# Patient Record
Sex: Male | Born: 1970
Health system: Southern US, Community
[De-identification: ages and names within clinical notes are randomized; demographics above are authoritative.]

## PROBLEM LIST (undated history)

## (undated) DIAGNOSIS — E119 Type 2 diabetes mellitus without complications: Secondary | ICD-10-CM

## (undated) DIAGNOSIS — F419 Anxiety disorder, unspecified: Secondary | ICD-10-CM

## (undated) DIAGNOSIS — D571 Sickle-cell disease without crisis: Secondary | ICD-10-CM

## (undated) DIAGNOSIS — I10 Essential (primary) hypertension: Secondary | ICD-10-CM

## (undated) HISTORY — PX: DENTAL SURGERY: SHX609

## (undated) HISTORY — DX: Type 2 diabetes mellitus without complications: E11.9

## (undated) HISTORY — DX: Anxiety disorder, unspecified: F41.9

## (undated) HISTORY — PX: CHOLECYSTECTOMY: SHX55

## (undated) HISTORY — PX: OTHER SURGICAL HISTORY: SHX169

## (undated) HISTORY — DX: Sickle-cell disease without crisis: D57.1

## (undated) HISTORY — PX: ROTATOR CUFF REPAIR: SHX139

---

## 1998-12-21 ENCOUNTER — Encounter: Payer: Self-pay | Admitting: Cardiology

## 1998-12-21 ENCOUNTER — Ambulatory Visit (HOSPITAL_COMMUNITY): Admission: RE | Admit: 1998-12-21 | Discharge: 1998-12-21 | Payer: Self-pay | Admitting: Cardiology

## 2001-02-12 ENCOUNTER — Other Ambulatory Visit: Admission: RE | Admit: 2001-02-12 | Discharge: 2001-02-12 | Payer: Self-pay | Admitting: Orthopedic Surgery

## 2006-03-02 ENCOUNTER — Emergency Department (HOSPITAL_COMMUNITY): Admission: EM | Admit: 2006-03-02 | Discharge: 2006-03-02 | Payer: Self-pay | Admitting: Emergency Medicine

## 2009-10-16 ENCOUNTER — Emergency Department (HOSPITAL_COMMUNITY): Admission: EM | Admit: 2009-10-16 | Discharge: 2009-10-17 | Payer: Self-pay | Admitting: Emergency Medicine

## 2010-09-26 ENCOUNTER — Ambulatory Visit: Payer: Self-pay | Admitting: Cardiology

## 2010-09-26 ENCOUNTER — Telehealth (INDEPENDENT_AMBULATORY_CARE_PROVIDER_SITE_OTHER): Payer: Self-pay | Admitting: Radiology

## 2010-09-27 ENCOUNTER — Encounter: Payer: Self-pay | Admitting: Cardiology

## 2010-09-27 ENCOUNTER — Encounter (INDEPENDENT_AMBULATORY_CARE_PROVIDER_SITE_OTHER): Payer: Self-pay | Admitting: *Deleted

## 2010-09-27 ENCOUNTER — Ambulatory Visit: Payer: Self-pay

## 2010-09-27 ENCOUNTER — Encounter (HOSPITAL_COMMUNITY)
Admission: RE | Admit: 2010-09-27 | Discharge: 2010-11-15 | Payer: Self-pay | Source: Home / Self Care | Attending: Cardiology | Admitting: Cardiology

## 2010-09-29 ENCOUNTER — Ambulatory Visit: Payer: Self-pay | Admitting: Cardiology

## 2010-10-04 ENCOUNTER — Ambulatory Visit: Payer: Self-pay | Admitting: Cardiology

## 2010-11-17 NOTE — Progress Notes (Signed)
Summary: Nuc Pre-Procedure  Phone Note Outgoing Call Call back at [facility] Phone [phone]   Call placed by: Leonia Corona, RT-N,  September 26, 2010 4:46 PM Call placed to: Patient Reason for Call: Confirm/change Appt Summary of Call: Left message with information on My[employer] Information Sheet (see scanned document for details).      Nuclear Med Background Indications for Stress Test: Evaluation for Ischemia     Symptoms: Chest Pain

## 2010-11-17 NOTE — Assessment & Plan Note (Signed)
Summary: Cardiology Nuclear Testing  Nuclear Med Background Indications for Stress Test: Evaluation for Ischemia     Symptoms: Chest Pain, Chest Pain with Exertion, SOB    Nuclear Pre-Procedure Caffeine/Decaff Intake: None NPO After: 7:00 AM Lungs: clear IV 0.9% NS with Angio Cath: 22g     IV Site: R Antecubital IV Started by: Burna Mortimer Deal, RT-N Chest Size (in) 42     Height (in): 70 Weight (lb): 215 BMI: 30.96  Nuclear Med Study 1 or 2 day study:  1 day     Stress Test Type:  Stress Reading MD:  Olga Millers, MD     Referring MD:  S.Tennant Resting Radionuclide:  Technetium 75m Tetrofosmin     Resting Radionuclide Dose:  11.0 mCi  Stress Radionuclide:  Technetium 51m Tetrofosmin     Stress Radionuclide Dose:  33.0 mCi   Stress Protocol Exercise Time (min):  8:00 min     Max HR:  164 bpm     Predicted Max HR:  179 bpm  Max Systolic BP: 162 mm Hg     Percent Max HR:  91.62 %     METS: 10.10 Rate Pressure Product:  16109    Stress Test Technologist:  Milana Na, EMT-P     Nuclear Technologist:  Domenic Polite, CNMT  Rest Procedure  Myocardial perfusion imaging was performed at rest 45 minutes following the intravenous administration of Technetium 1m Tetrofosmin.  Stress Procedure  The patient exercised for 8:00.  The patient stopped due to fatigue and chest tightness.  There were no significant ST-T wave changes.  Technetium 28m Tetrofosmin was injected at peak exercise and myocardial perfusion imaging was performed after a brief delay.  QPS Raw Data Images:  Acquisition technically good; normal left ventricular size. Stress Images:  There is decreased uptake in the apex. Rest Images:  There is decreased uptake in the apex. Subtraction (SDS):  No evidence of ischemia. Transient Ischemic Dilatation:  1.01  (Normal <1.22)  Lung/Heart Ratio:  .22  (Normal <0.45)  Quantitative Gated Spect Images QGS EDV:  106 ml QGS ESV:  45 ml QGS EF:  58 % QGS cine images:   Normal wall motion.   Overall Impression  Exercise Capacity: Fair exercise capacity. BP Response: Normal blood pressure response. Clinical Symptoms: There is chest pain ECG Impression: No significant ST segment change suggestive of ischemia. Overall Impression: Normal stress nuclear study with mild apical thinning but no ischemia or infarction.  Appended Document: Cardiology Nuclear Testing copy sent to Dr. Deborah Chalk

## 2010-11-17 NOTE — Letter (Signed)
Summary: Outpatient Coinsurance Notice  Outpatient Coinsurance Notice   Imported By: Marylou Mccoy 10/03/2010 18:43:35  _____________________________________________________________________  External Attachment:    Type:   Image     Comment:   External Document

## 2011-01-01 LAB — COMPREHENSIVE METABOLIC PANEL
ALT: 28 U/L (ref 0–53)
AST: 24 U/L (ref 0–37)
Albumin: 4.2 g/dL (ref 3.5–5.2)
Alkaline Phosphatase: 80 U/L (ref 39–117)
BUN: 6 mg/dL (ref 6–23)
Chloride: 105 mEq/L (ref 96–112)
GFR calc Af Amer: >60 mL/min (ref >60)
Potassium: 3.6 mEq/L (ref 3.5–5.1)
Sodium: 140 mEq/L (ref 135–145)
Total Bilirubin: 1.3 mg/dL — ABNORMAL HIGH (ref 0.3–1.2)

## 2011-01-01 LAB — URINALYSIS, ROUTINE W REFLEX MICROSCOPIC
Bilirubin Urine: NEGATIVE
Glucose, UA: NEGATIVE mg/dL
Hgb urine dipstick: NEGATIVE
Ketones, ur: NEGATIVE mg/dL
Nitrite: NEGATIVE
Protein, ur: NEGATIVE mg/dL
Specific Gravity, Urine: 1.019 (ref 1.005–1.030)
Urobilinogen, UA: 1 mg/dL (ref 0.0–1.0)
pH: 6 (ref 5.0–8.0)

## 2011-01-01 LAB — DIFFERENTIAL
Basophils Absolute: 0 10*3/uL (ref 0.0–0.1)
Basophils Relative: 1 % (ref 0–1)
Eosinophils Absolute: 0.6 10*3/uL (ref 0.0–0.7)
Eosinophils Relative: 8 % — ABNORMAL HIGH (ref 0–5)
Monocytes Absolute: 0.7 10*3/uL (ref 0.1–1.0)

## 2011-01-01 LAB — RAPID URINE DRUG SCREEN, HOSP PERFORMED
Amphetamines: NOT DETECTED
Barbiturates: NOT DETECTED
Benzodiazepines: NOT DETECTED
Cocaine: NOT DETECTED
Opiates: NOT DETECTED
Tetrahydrocannabinol: NOT DETECTED

## 2011-01-01 LAB — CBC
HCT: 40.1 % (ref 39.0–52.0)
Platelets: 208 10*3/uL (ref 150–400)
WBC: 7.2 10*3/uL (ref 4.0–10.5)

## 2011-01-01 LAB — ETHANOL: Alcohol, Ethyl (B): <5 mg/dL (ref 0–10)

## 2011-07-12 ENCOUNTER — Emergency Department (HOSPITAL_COMMUNITY)
Admission: EM | Admit: 2011-07-12 | Discharge: 2011-07-12 | Disposition: A | Discharge status: Home / Self Care | Payer: 59 | Attending: Emergency Medicine | Admitting: Emergency Medicine

## 2011-07-12 ENCOUNTER — Emergency Department (HOSPITAL_COMMUNITY): Payer: 59

## 2011-07-12 DIAGNOSIS — D573 Sickle-cell trait: Secondary | ICD-10-CM | POA: Insufficient documentation

## 2011-07-12 DIAGNOSIS — M25519 Pain in unspecified shoulder: Secondary | ICD-10-CM | POA: Insufficient documentation

## 2011-07-12 DIAGNOSIS — J189 Pneumonia, unspecified organism: Secondary | ICD-10-CM | POA: Insufficient documentation

## 2011-07-12 DIAGNOSIS — R079 Chest pain, unspecified: Secondary | ICD-10-CM | POA: Insufficient documentation

## 2011-07-12 DIAGNOSIS — Z79899 Other long term (current) drug therapy: Secondary | ICD-10-CM | POA: Insufficient documentation

## 2011-07-12 LAB — CBC
HCT: 36.6 % — ABNORMAL LOW (ref 39.0–52.0)
MCH: 25.6 pg — ABNORMAL LOW (ref 26.0–34.0)
MCHC: 35.5 g/dL (ref 30.0–36.0)
RDW: 16.1 % — ABNORMAL HIGH (ref 11.5–15.5)

## 2011-07-12 LAB — POCT I-STAT, CHEM 8
Calcium, Ion: 1.19 mmol/L (ref 1.12–1.32)
Glucose, Bld: 113 mg/dL — ABNORMAL HIGH (ref 70–99)
HCT: 42 % (ref 39.0–52.0)
TCO2: 26 mmol/L (ref 0–100)

## 2011-07-12 LAB — DIFFERENTIAL
Basophils Absolute: 0 10*3/uL (ref 0.0–0.1)
Basophils Relative: 0 % (ref 0–1)
Eosinophils Relative: 2 % (ref 0–5)
Lymphocytes Relative: 11 % — ABNORMAL LOW (ref 12–46)
Monocytes Absolute: 1.2 10*3/uL — ABNORMAL HIGH (ref 0.1–1.0)
Monocytes Relative: 10 % (ref 3–12)

## 2011-07-12 LAB — POCT I-STAT TROPONIN I: Troponin i, poc: 0 ng/mL (ref 0.00–0.08)

## 2011-07-25 ENCOUNTER — Other Ambulatory Visit: Payer: Self-pay | Admitting: Family Medicine

## 2011-07-25 ENCOUNTER — Ambulatory Visit
Admission: RE | Admit: 2011-07-25 | Discharge: 2011-07-25 | Disposition: A | Discharge status: Home / Self Care | Payer: 59 | Source: Ambulatory Visit | Attending: Family Medicine | Admitting: Family Medicine

## 2011-07-25 DIAGNOSIS — J189 Pneumonia, unspecified organism: Secondary | ICD-10-CM

## 2011-07-25 MED ORDER — IOHEXOL 300 MG/ML  SOLN: 100.0000 mL | Freq: Once | INTRAMUSCULAR | Status: AC | PRN

## 2011-07-31 ENCOUNTER — Ambulatory Visit (INDEPENDENT_AMBULATORY_CARE_PROVIDER_SITE_OTHER): Payer: 59 | Admitting: Pulmonary Disease

## 2011-07-31 ENCOUNTER — Encounter: Payer: Self-pay | Admitting: Pulmonary Disease

## 2011-07-31 VITALS — BP 112/76 | HR 96 | Temp 97.9°F | Ht 70.0 in | Wt 215.4 lb

## 2011-07-31 DIAGNOSIS — J90 Pleural effusion, not elsewhere classified: Secondary | ICD-10-CM

## 2011-07-31 NOTE — Assessment & Plan Note (Signed)
The patient is having persistent left chest pain with a left lower lobe infiltrate and moderate-sized effusion on chest CT.  I am presuming this is pneumonia, with a parapneumonic effusion, although his clinical history was not overly impressive for this diagnosis.  He has no risk factors for thromboembolic disease, and his CT chest ruled out PE to at least large and medium-sized vessels.  The patient does have a history of sickle cell anemia, but I would find it unlikely this represents acute chest syndrome since I would expect him to be much sicker.  At this point, I would like to do thoracentesis for fluid removal and analysis, and we'll then see how he does.

## 2011-07-31 NOTE — Patient Instructions (Signed)
Will set up procedure to have fluid removed from chest. Will call once I have the analysis back on the fluid.

## 2011-07-31 NOTE — Progress Notes (Signed)
  Subjective:    Patient ID: William Lin, male    DOB: 1971-05-13, 40 y.o.   MRN: [id number]  HPI The patient is a 40 year old male who I have been asked to see for left-sided chest pain.  The patient was in his usual state of health until the end of September,  when he developed a fairly rapid onset of left-sided chest pain.  He denied any cough or mucus production at that time, but did note shortness of breath if he tried to lie down.  He presented to the emergency room where a chest x-ray showed very small lung volumes because of splinting, as well as bi-basilar atelectasis/airspace disease.  He was treated with a Z-Pak, and although he felt a little better he still had left-sided chest pain.  He was seen in his primary care office the first day of October, and was treated with 10 days of Avelox.  He came back for followup on October 8, and a chest x-ray showed an increasing infiltrate in the left base, and improvement on the right.  He subsequently underwent CT chest which showed a dense left lower lobe infiltrate and moderate left pleural effusion.  There were really no other significant abnormalities.  There was no evidence for pulmonary embolus in the large and medium sized vessels, however a small clot distally could not be excluded.  The patient during all of this did develop a dry cough, but had no chest congestion or mucus.  He denied any fever, but would have chills.  The patient denies any lower extremity swelling, and has no risk factors for thromboembolic disease.  He does have a history of sickle cell anemia, but has not had a sickle cell crisis ever.   Review of Systems  Constitutional: Negative for fever and unexpected weight change.  HENT: Negative for ear pain, nosebleeds, congestion, sore throat, rhinorrhea, sneezing, trouble swallowing, dental problem, postnasal drip and sinus pressure.   Eyes: Negative for redness and itching.  Respiratory: Positive for cough and shortness of  breath. Negative for chest tightness and wheezing.   Cardiovascular: Positive for chest pain. Negative for palpitations and leg swelling.  Gastrointestinal: Negative for nausea and vomiting.  Genitourinary: Negative for dysuria.  Musculoskeletal: Negative for joint swelling.  Skin: Negative for rash.  Neurological: Positive for headaches.  Hematological: Does not bruise/bleed easily.  Psychiatric/Behavioral: Negative for dysphoric mood. The patient is nervous/anxious.        Objective:   Physical Exam Constitutional:  Well developed, no acute distress  HENT:  Nares patent without discharge  Oropharynx without exudate, palate and uvula are normal  Eyes:  Perrla, eomi, no scleral icterus  Neck:  No JVD, no TMG  Cardiovascular:  Normal rate, regular rhythm, no rubs or gallops.  No murmurs        Intact distal pulses  Pulmonary :  Decreased bs left base with dullness to percussion, no stridor or respiratory distress   No rales, rhonchi, or wheezing  Abdominal:  Soft, nondistended, bowel sounds present.  No tenderness noted.   Musculoskeletal:  No lower extremity edema noted.  Lymph Nodes:  No cervical lymphadenopathy noted  Skin:  No cyanosis noted  Neurologic:  Alert, appropriate, moves all 4 extremities without obvious deficit.         Assessment & Plan:

## 2011-08-01 ENCOUNTER — Other Ambulatory Visit: Payer: Self-pay | Admitting: Pulmonary Disease

## 2011-08-01 ENCOUNTER — Telehealth: Payer: Self-pay | Admitting: Pulmonary Disease

## 2011-08-01 ENCOUNTER — Ambulatory Visit (HOSPITAL_COMMUNITY)
Admission: RE | Admit: 2011-08-01 | Discharge: 2011-08-01 | Disposition: A | Discharge status: Home / Self Care | Payer: 59 | Source: Ambulatory Visit | Attending: Pulmonary Disease | Admitting: Pulmonary Disease

## 2011-08-01 ENCOUNTER — Other Ambulatory Visit: Payer: Self-pay | Admitting: Interventional Radiology

## 2011-08-01 DIAGNOSIS — J90 Pleural effusion, not elsewhere classified: Secondary | ICD-10-CM | POA: Insufficient documentation

## 2011-08-01 DIAGNOSIS — J189 Pneumonia, unspecified organism: Secondary | ICD-10-CM

## 2011-08-01 LAB — BODY FLUID CELL COUNT WITH DIFFERENTIAL
Eos, Fluid: 0 %
Lymphs, Fluid: 54 %
Other Cells, Fluid: 0 %

## 2011-08-01 LAB — PROTEIN, BODY FLUID: Total protein, fluid: 5.9 g/dL

## 2011-08-01 LAB — PATHOLOGIST SMEAR REVIEW

## 2011-08-01 LAB — GLUCOSE, SEROUS FLUID

## 2011-08-01 NOTE — Telephone Encounter (Signed)
Spoke with patient-aware that we will not have the results back this fast as we have to send the fluid out and [facility] will call patient with results per OV notes on 07-31-11; pt will then know when to follow up with [facility] as well. If any other questions or concerns call our office.  Will send to [facility] as a reminder.

## 2011-08-02 NOTE — Telephone Encounter (Signed)
Discussed results with pt, and explained is exudative and prob due to parapneumonic effusion.  I asked him to call for appt to see me in 3wks with cxr, and to call if pleuritic pain worsens.

## 2011-08-03 NOTE — Telephone Encounter (Signed)
Pt called back.  Scheduled appt with KC on 08/24/11 @ 9:45am.  Patient aware.  [facility]

## 2011-08-03 NOTE — Telephone Encounter (Signed)
lmomtcb so that we can sched this 3 wk rov

## 2011-08-05 LAB — BODY FLUID CULTURE: Culture: NO GROWTH

## 2011-08-16 ENCOUNTER — Institutional Professional Consult (permissible substitution): Payer: 59 | Admitting: Internal Medicine

## 2011-08-23 ENCOUNTER — Other Ambulatory Visit: Payer: Self-pay | Admitting: Pulmonary Disease

## 2011-08-23 DIAGNOSIS — J90 Pleural effusion, not elsewhere classified: Secondary | ICD-10-CM

## 2011-08-24 ENCOUNTER — Ambulatory Visit (INDEPENDENT_AMBULATORY_CARE_PROVIDER_SITE_OTHER)
Admission: RE | Admit: 2011-08-24 | Discharge: 2011-08-24 | Disposition: A | Discharge status: Home / Self Care | Payer: 59 | Source: Ambulatory Visit | Attending: Pulmonary Disease | Admitting: Pulmonary Disease

## 2011-08-24 ENCOUNTER — Ambulatory Visit (INDEPENDENT_AMBULATORY_CARE_PROVIDER_SITE_OTHER): Payer: 59 | Admitting: Pulmonary Disease

## 2011-08-24 ENCOUNTER — Encounter: Payer: Self-pay | Admitting: Pulmonary Disease

## 2011-08-24 VITALS — BP 106/62 | HR 92 | Temp 97.8°F | Ht 70.0 in | Wt 216.2 lb

## 2011-08-24 DIAGNOSIS — J90 Pleural effusion, not elsewhere classified: Secondary | ICD-10-CM

## 2011-08-24 NOTE — Assessment & Plan Note (Signed)
The patient has had a left pleural effusion that I suspect is parapneumonic in nature.  His thoracentesis was exudative in nature, but otherwise nonspecific.  His chest x-ray today shows minimal residual thickening/fluid, and he is clearly improved clinically.  At this point, I would recommend no further intervention, and we'll see how he does over time.

## 2011-08-24 NOTE — Patient Instructions (Signed)
Your chest xray is much improved.  No further intervention recommended. followup with me as needed.

## 2011-08-24 NOTE — Progress Notes (Signed)
  Subjective:    Patient ID: William Lin, male    DOB: [birth year], 40 y.o.   MRN: [id number]  HPI Patient comes in today for followup of his left pleural effusion.  He has undergone thoracentesis which showed a nonspecific exudate.  This was felt to be most consistent with a parapneumonic effusion, and he had received adequate antibiotic treatment.  He is had a followup chest x-ray today which shows much improved lung volumes, and minimal pleural thickening/fluid on the lateral view.  The patient states that his chest discomfort has nearly totally resolved, and only on occasions does he feel discomfort.  He feels that his breathing is improved, and he is taking deeper breaths.   Review of Systems  Constitutional: Negative for fever and unexpected weight change.  HENT: Negative for ear pain, nosebleeds, congestion, sore throat, rhinorrhea, sneezing, trouble swallowing, dental problem, postnasal drip and sinus pressure.   Eyes: Negative for redness and itching.  Respiratory: Positive for shortness of breath. Negative for cough, chest tightness and wheezing.   Cardiovascular: Negative for palpitations and leg swelling.  Gastrointestinal: Negative for nausea and vomiting.  Genitourinary: Negative for dysuria.  Musculoskeletal: Negative for joint swelling.  Skin: Negative for rash.  Neurological: Negative for headaches.  Hematological: Does not bruise/bleed easily.  Psychiatric/Behavioral: Negative for dysphoric mood. The patient is nervous/anxious.        Objective:   Physical Exam Well-developed male in no acute distress  Nose without purulence or discharge noted Chest with very minimal left basilar crackles, otherwise totally clear Cardiac exam is regular rate and rhythm Lower extremities without edema, no cyanosis noted Alert and oriented, moves all 4 extremities.       Assessment & Plan:

## 2012-01-18 ENCOUNTER — Encounter: Payer: Self-pay | Admitting: *Deleted

## 2012-01-31 ENCOUNTER — Ambulatory Visit (INDEPENDENT_AMBULATORY_CARE_PROVIDER_SITE_OTHER)
Admission: RE | Admit: 2012-01-31 | Discharge: 2012-01-31 | Disposition: A | Discharge status: Home / Self Care | Payer: 59 | Source: Ambulatory Visit | Attending: Pulmonary Disease | Admitting: Pulmonary Disease

## 2012-01-31 ENCOUNTER — Encounter: Payer: Self-pay | Admitting: Pulmonary Disease

## 2012-01-31 ENCOUNTER — Ambulatory Visit (INDEPENDENT_AMBULATORY_CARE_PROVIDER_SITE_OTHER): Payer: 59 | Admitting: Pulmonary Disease

## 2012-01-31 VITALS — BP 118/78 | HR 87 | Temp 97.8°F | Ht 70.5 in | Wt 225.8 lb

## 2012-01-31 DIAGNOSIS — J90 Pleural effusion, not elsewhere classified: Secondary | ICD-10-CM

## 2012-01-31 NOTE — Patient Instructions (Signed)
Will check cxr today, and call you with results. If the xray looks worse, will need to rescan your chest.  If better or normal, would not intervene, and see how your symptoms do over time.  If your discomfort continues, and it is bothering you enough to consider aggressive intervention, let me know.

## 2012-01-31 NOTE — Progress Notes (Signed)
  Subjective:    Patient ID: William Lin, male    DOB: 1971-03-19, 41 y.o.   MRN: [id number]  HPI The patient comes in today for an acute sick visit.  He has a history of pleural thickening and parapneumonic effusion, however at the last visit was felt to have a fairly good result from his initial insult.  He had been doing fairly well, but continues to have intermittent chest discomfort on the left laterally that is very similar to his pain in the past when all this started.  It typically can occur weekly to every other day, and is mild in nature compared to his initial problem.  Continues to have a sharp character to the discomfort.  He does not have substernal chest pressure.  The patient has had no significant cough, change in his breathing, or fever.   Review of Systems  Constitutional: Negative.  Negative for fever and unexpected weight change.  HENT: Negative.  Negative for ear pain, nosebleeds, congestion, sore throat, rhinorrhea, sneezing, trouble swallowing, dental problem, postnasal drip and sinus pressure.   Eyes: Negative.  Negative for redness and itching.  Respiratory: Positive for chest tightness. Negative for cough, shortness of breath and wheezing.   Cardiovascular: Positive for chest pain. Negative for palpitations and leg swelling.  Gastrointestinal: Negative.  Negative for nausea and vomiting.  Genitourinary: Negative.  Negative for dysuria.  Musculoskeletal: Negative.  Negative for joint swelling.  Skin: Negative.  Negative for rash.  Neurological: Negative.  Negative for headaches.  Hematological: Negative.  Does not bruise/bleed easily.  Psychiatric/Behavioral: Negative.  Negative for dysphoric mood. The patient is not nervous/anxious.        Objective:   Physical Exam Well-developed male in no acute distress Nose without purulence or discharge noted Chest with good air flow bilaterally, mild decrease in breath sounds in the left base.  No significant crackles or  rubs noted Cardiac exam with regular rate and rhythm Lower extremities without edema, no cyanosis Alert and oriented, moves all 4 extremities.       Assessment & Plan:

## 2012-01-31 NOTE — Assessment & Plan Note (Signed)
The patient is having some mild chest discomfort on the left that is similar to his discomfort in the past.  It is not every day, and it is not really severe.  The patient had some concerns, and just wanted to make sure there were no complicating factors.  He does not have worsening breath sounds or a rub on exam today.  I would like to check a chest x-ray today to make sure the chronic changes have not worsened.  If it is stable or better, I would continue to follow this for now.  If the x-ray is worse, or the patient is having discomfort that he feels is significantly impacting him, would do followup CT chest.

## 2014-04-14 ENCOUNTER — Encounter: Payer: Self-pay | Admitting: Cardiology

## 2015-09-15 ENCOUNTER — Encounter: Payer: Commercial Managed Care - HMO | Attending: Family Medicine | Admitting: *Deleted

## 2015-09-15 ENCOUNTER — Encounter: Payer: Self-pay | Admitting: *Deleted

## 2015-09-15 VITALS — Ht 70.5 in | Wt 220.1 lb

## 2015-09-15 DIAGNOSIS — E119 Type 2 diabetes mellitus without complications: Secondary | ICD-10-CM | POA: Insufficient documentation

## 2015-09-15 DIAGNOSIS — Z713 Dietary counseling and surveillance: Secondary | ICD-10-CM | POA: Diagnosis not present

## 2015-09-15 NOTE — Patient Instructions (Signed)
Plan:  Include protein in moderation with your meals and snacks Consider  increasing your activity level by walking for 30 minutes daily as tolerated Continue taking medication as directed by MD  Decrease pure fruit juice / dilute Consider water with flavorings Tea with 1/2 & 1/2 or unsweet + splenda Rather than a biscuit consider an [place] Muffin Only have 2 slices of Pizza rather than 3, consider having a salad with it Discontinue Pop Tarts  ALWAYS HAVE PROTEIN WITH YOUR CARBOHYDRATE! Consider Dannon Lite & Fit Greek Yogurt

## 2015-09-15 NOTE — Progress Notes (Signed)
Diabetes Self-Management Education  Visit Type: First/Initial  Appt. Start Time: 0800 Appt. End Time: 1000  09/15/2015  [name], identified by name and date of birth, is a 44 y.o. male with a diagnosis of Diabetes: Type 2. William Lin presents with a new diagnosis of T2DM. He is married with children and notes a great deal of transition and stress in the household. He works full-time for [employer] in [employer]. His job is sedentary and travels occasionally. Fast Food is common in their household. In review of his dietary intake he consumes a great deal of fruit and fruit juice. This morning he had 2 pieces of fruit for breakfast. 2hpp reading 277mg /dl. He voiced a desire to test his glucose: Meter dispensed: One Touch Verio Flex, [id number] X, Exp. 09/2016. I suggested her contact Dr. Hyacinth MeekerMiller for an order for testing supplies.   ASSESSMENT  Height 5' 10.5" (1.791 m), weight 220 lb 1.6 oz (99.837 kg). Body mass index is 31.12 kg/(m^2).      Diabetes Self-Management Education - 09/15/15 0840    Visit Information   Visit Type First/Initial   Initial Visit   Diabetes Type Type 2   Are you currently following a meal plan? No   Are you taking your medications as prescribed? Yes   Date Diagnosed 09/05/15   Health Coping   How would you rate your overall health? Good   Psychosocial Assessment   Patient Belief/Attitude about Diabetes Afraid   Self-care barriers None   Self-management support Doctor's office;Family;CDE visits   Other persons present Patient   Patient Concerns Nutrition/Meal planning;Medication;Monitoring;Healthy Lifestyle;Glycemic Control   Special Needs None   Preferred Learning Style No preference indicated   Learning Readiness Change in progress   How often do you need to have someone help you when you read instructions, pamphlets, or other written materials from your doctor or pharmacy? 1 - Never   What is the last grade level you  completed in school? Bachelor Degree   Complications   Last HgB A1C per patient/outside source 6.1 %   How often do you check your blood sugar? Not recommended by provider   Have you had a dilated eye exam in the past 12 months? Yes   Have you had a dental exam in the past 12 months? Yes   Are you checking your feet? No   Dietary Intake   Breakfast toast & butter, apple grape juice / sausage & egg biscuit, sweet tea/ pop tart   Lunch sushi, [place], [employer]   Snack (afternoon) [place] tea, brownie, Designer, industrial/productswiss roll   Dinner pizza, McDonal[employer], (fast food)   Snack (evening) Oatmeal Raisin cookies, fruit   Beverage(s) fruit juice, Lipton Green Tea, Regular decaf soda, Hi-C Orange, lemonade   Exercise   Exercise Type ADL's   Patient Education   Previous Diabetes Education No   Disease state  Definition of diabetes, type 1 and 2, and the diagnosis of diabetes;Factors that contribute to the development of diabetes   Nutrition management  Role of diet in the treatment of diabetes and the relationship between the three main macronutrients and blood glucose level;Meal options for control of blood glucose level and chronic complications.   Physical activity and exercise  Role of exercise on diabetes management, blood pressure control and cardiac health.   Medications Reviewed patients medication for diabetes, action, purpose, timing of dose and side effects.   Monitoring Taught/evaluated SMBG meter.;Purpose and frequency of SMBG.;Taught/discussed recording of test results and  interpretation of SMBG.   Chronic complications Relationship between chronic complications and blood glucose control   Psychosocial adjustment Role of stress on diabetes   Personal strategies to promote health Lifestyle issues that need to be addressed for better diabetes care   Individualized Goals (developed by patient)   Nutrition General guidelines for healthy choices and portions discussed   Physical Activity Exercise 3-5  times per week;30 minutes per day   Medications take my medication as prescribed   Monitoring  test blood glucose pre and post meals as discussed  Patient requested glucose meter and desire to test.   Reducing Risk examine blood glucose patterns;increase portions of nuts and seeds;increase portions of olive oil in diet   Outcomes   Expected Outcomes Demonstrated interest in learning. Expect positive outcomes   Future DMSE 4-6 wks   Program Status Completed      Individualized Plan for Diabetes Self-Management Training:   Learning Objective:  Patient will have a greater understanding of diabetes self-management. Patient education plan is to attend individual and/or group sessions per assessed needs and concerns.   Plan:   Patient Instructions  Plan:  Include protein in moderation with your meals and snacks Consider  increasing your activity level by walking for 30 minutes daily as tolerated Continue taking medication as directed by MD  Decrease pure fruit juice / dilute Consider water with flavorings Tea with 1/2 & 1/2 or unsweet + splenda Rather than a biscuit consider an [place] Muffin Only have 2 slices of Pizza rather than 3, consider having a salad with it Discontinue Pop Tarts  ALWAYS HAVE PROTEIN WITH YOUR CARBOHYDRATE! Consider Dannon Lite & Fit Greek Yogurt  Expected Outcomes:  Demonstrated interest in learning. Expect positive outcomes  Education material provided: Living Well with Diabetes, A1C conversion sheet, Meal plan card, My Plate and Snack sheet  If problems or questions, patient to contact team via:  Phone  Future DSME appointment: 4-6 wks

## 2015-10-19 ENCOUNTER — Encounter: Payer: Commercial Managed Care - HMO | Attending: Family Medicine | Admitting: *Deleted

## 2015-10-19 ENCOUNTER — Encounter: Payer: Self-pay | Admitting: *Deleted

## 2015-10-19 VITALS — Ht 70.5 in | Wt 213.9 lb

## 2015-10-19 DIAGNOSIS — E119 Type 2 diabetes mellitus without complications: Secondary | ICD-10-CM | POA: Insufficient documentation

## 2015-10-19 DIAGNOSIS — Z713 Dietary counseling and surveillance: Secondary | ICD-10-CM | POA: Insufficient documentation

## 2015-10-19 NOTE — Progress Notes (Signed)
Diabetes Self-Management Education  Visit Type: Follow-up  Appt. Start Time: 0800 Appt. End Time: 0900  10/19/2015  [name], identified by name and date of birth, is a 45 y.o. male with a diagnosis of Diabetes: Type 2. [name] returns for 6 week follow up related to T2DM. He has mad significant dietary modifications. In review of his intake we do still have some opportunity for improvement. His goal is to be able to discontinue his diabetes medication and manage is glucose through dietary modification and exercise. He understands that we do not cure diabetes but we manage it to the best of our ability. He has not integrated exercise into his daily route thus far. This is part of his plan.  ASSESSMENT  Height 5' 10.5" (1.791 m), weight 213 lb 14.4 oz (97.024 kg). Body mass index is 30.25 kg/(m^2).      Diabetes Self-Management Education - 10/19/15 [id number]    Visit Information   Visit Type Follow-up   Initial Visit   Diabetes Type Type 2   Are you currently following a meal plan? No   Are you taking your medications as prescribed? Yes   Health Coping   How would you rate your overall health? Good   Psychosocial Assessment   Patient Belief/Attitude about Diabetes Motivated to manage diabetes   Self-care barriers None   Self-management support Doctor's office;CDE visits;Family   Other persons present Patient   Patient Concerns Nutrition/Meal planning;Healthy Lifestyle;Glycemic Control   Special Needs None   Preferred Learning Style No preference indicated   Learning Readiness Change in progress   How often do you need to have someone help you when you read instructions, pamphlets, or other written materials from your doctor or pharmacy? 1 - Never   Complications   How often do you check your blood sugar? 1-2 times/day   Fasting Blood glucose range (mg/dL) [phone]  [phone]   Postprandial Blood glucose range (mg/dL) 47-425;>95670-129;>200  [phone]   Dietary Intake   Breakfast raisin  bran, 2% milk   6oz milk / yogurt Yoplait (/ fruit    Lunch frozen meals 45 grams of carbs   Snack (afternoon) fiber one bar   Dinner balanced protein, vegetable, carbs   Beverage(s) ICE beverage, apple juice, water with crystal light   Exercise   Exercise Type ADL's   Patient Education   Previous Diabetes Education Yes (please comment)   Nutrition management  Role of diet in the treatment of diabetes and the relationship between the three main macronutrients and blood glucose level;Meal options for control of blood glucose level and chronic complications.   Physical activity and exercise  Role of exercise on diabetes management, blood pressure control and cardiac health.   Medications Reviewed patients medication for diabetes, action, purpose, timing of dose and side effects.   Monitoring Purpose and frequency of SMBG.   Chronic complications Relationship between chronic complications and blood glucose control   Psychosocial adjustment Role of stress on diabetes;Identified and addressed patients feelings and concerns about diabetes  Patient goal is to come off medication and manage glucose with nutrition and exercise   Personal strategies to promote health Lifestyle issues that need to be addressed for better diabetes care  begin exercise regimen   Individualized Goals (developed by patient)   Nutrition General guidelines for healthy choices and portions discussed   Physical Activity Exercise 5-7 days per week;30 minutes per day   Medications take my medication as prescribed   Monitoring  test my  blood glucose as discussed   Problem Solving --  begin activity regimen for healthy heart, BP, cholesterol and glucose management   Reducing Risk increase portions of nuts and seeds   Patient Self-Evaluation of Goals - Patient rates self as meeting previously set goals (% of time)   Nutrition 50 - 75 %   Physical Activity < 25%   Medications >75%   Monitoring >75%   Reducing Risk 50 - 75 %    Health Coping 50 - 75 %   Outcomes   Expected Outcomes Demonstrated interest in learning. Expect positive outcomes   Future DMSE PRN   Program Status Completed   Subsequent Visit   Since your last visit have you continued or begun to take your medications as prescribed? Yes   Since your last visit have you experienced any weight changes? Loss   Weight Loss (lbs) 7   Since your last visit, are you checking your blood glucose at least once a day? Yes      Individualized Plan for Diabetes Self-Management Training:   Learning Objective:  Patient will have a greater understanding of diabetes self-management. Patient education plan is to attend individual and/or group sessions per assessed needs and concerns.   Plan:   Patient Instructions  Snack bars: [facility] Protein bars, [employer] PROTEIN If you have a piece of fruit always put some protein with it. 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 Expect positive outcomes  If problems or questions, patient to contact team via:  Phone  Future DSME appointment: PRN

## 2015-10-19 NOTE — Patient Instructions (Addendum)
Snack bars: [facility] Protein bars, [employer] Bars PROTEIN If you have a piece of fruit always put some protein with it. 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

## 2016-11-24 DIAGNOSIS — E119 Type 2 diabetes mellitus without complications: Secondary | ICD-10-CM | POA: Diagnosis not present

## 2016-11-24 DIAGNOSIS — Z0000 Encounter for general adult medical examination without abnormal findings: Secondary | ICD-10-CM | POA: Diagnosis not present

## 2016-11-24 DIAGNOSIS — R05 Cough: Secondary | ICD-10-CM | POA: Diagnosis not present

## 2016-11-24 DIAGNOSIS — R079 Chest pain, unspecified: Secondary | ICD-10-CM | POA: Diagnosis not present

## 2016-11-24 DIAGNOSIS — Z23 Encounter for immunization: Secondary | ICD-10-CM | POA: Diagnosis not present

## 2017-05-15 DIAGNOSIS — G4733 Obstructive sleep apnea (adult) (pediatric): Secondary | ICD-10-CM | POA: Diagnosis not present

## 2017-05-31 DIAGNOSIS — R6889 Other general symptoms and signs: Secondary | ICD-10-CM | POA: Diagnosis not present

## 2017-05-31 DIAGNOSIS — D649 Anemia, unspecified: Secondary | ICD-10-CM | POA: Diagnosis not present

## 2017-05-31 DIAGNOSIS — E119 Type 2 diabetes mellitus without complications: Secondary | ICD-10-CM | POA: Diagnosis not present

## 2017-06-01 DIAGNOSIS — H5213 Myopia, bilateral: Secondary | ICD-10-CM | POA: Diagnosis not present

## 2017-06-01 DIAGNOSIS — E119 Type 2 diabetes mellitus without complications: Secondary | ICD-10-CM | POA: Diagnosis not present

## 2017-06-25 DIAGNOSIS — N6012 Diffuse cystic mastopathy of left breast: Secondary | ICD-10-CM | POA: Diagnosis not present

## 2017-11-27 DIAGNOSIS — E119 Type 2 diabetes mellitus without complications: Secondary | ICD-10-CM | POA: Diagnosis not present

## 2017-11-27 DIAGNOSIS — E611 Iron deficiency: Secondary | ICD-10-CM | POA: Diagnosis not present

## 2017-11-27 DIAGNOSIS — Z0000 Encounter for general adult medical examination without abnormal findings: Secondary | ICD-10-CM | POA: Diagnosis not present

## 2017-11-27 DIAGNOSIS — I10 Essential (primary) hypertension: Secondary | ICD-10-CM | POA: Diagnosis not present

## 2017-12-05 DIAGNOSIS — E119 Type 2 diabetes mellitus without complications: Secondary | ICD-10-CM | POA: Diagnosis not present

## 2017-12-11 DIAGNOSIS — R05 Cough: Secondary | ICD-10-CM | POA: Diagnosis not present

## 2017-12-11 DIAGNOSIS — J209 Acute bronchitis, unspecified: Secondary | ICD-10-CM | POA: Diagnosis not present

## 2017-12-11 DIAGNOSIS — K219 Gastro-esophageal reflux disease without esophagitis: Secondary | ICD-10-CM | POA: Diagnosis not present

## 2017-12-11 DIAGNOSIS — J189 Pneumonia, unspecified organism: Secondary | ICD-10-CM | POA: Diagnosis not present

## 2017-12-17 DIAGNOSIS — Z87898 Personal history of other specified conditions: Secondary | ICD-10-CM | POA: Diagnosis not present

## 2017-12-17 DIAGNOSIS — J40 Bronchitis, not specified as acute or chronic: Secondary | ICD-10-CM | POA: Diagnosis not present

## 2017-12-17 DIAGNOSIS — R05 Cough: Secondary | ICD-10-CM | POA: Diagnosis not present

## 2017-12-17 DIAGNOSIS — R0989 Other specified symptoms and signs involving the circulatory and respiratory systems: Secondary | ICD-10-CM | POA: Diagnosis not present

## 2018-01-08 ENCOUNTER — Other Ambulatory Visit: Payer: Self-pay | Admitting: Family Medicine

## 2018-01-08 DIAGNOSIS — R109 Unspecified abdominal pain: Secondary | ICD-10-CM

## 2018-01-17 ENCOUNTER — Ambulatory Visit
Admission: RE | Admit: 2018-01-17 | Discharge: 2018-01-17 | Disposition: A | Discharge status: Home / Self Care | Payer: Commercial Managed Care - HMO | Source: Ambulatory Visit | Attending: Family Medicine | Admitting: Family Medicine

## 2018-01-17 DIAGNOSIS — R109 Unspecified abdominal pain: Secondary | ICD-10-CM | POA: Diagnosis not present

## 2018-01-23 DIAGNOSIS — R109 Unspecified abdominal pain: Secondary | ICD-10-CM | POA: Diagnosis not present

## 2018-02-12 DIAGNOSIS — R05 Cough: Secondary | ICD-10-CM | POA: Diagnosis not present

## 2018-02-12 DIAGNOSIS — J45909 Unspecified asthma, uncomplicated: Secondary | ICD-10-CM | POA: Diagnosis not present

## 2018-03-05 DIAGNOSIS — R05 Cough: Secondary | ICD-10-CM | POA: Diagnosis not present

## 2018-03-05 DIAGNOSIS — E118 Type 2 diabetes mellitus with unspecified complications: Secondary | ICD-10-CM | POA: Diagnosis not present

## 2018-03-05 DIAGNOSIS — H6123 Impacted cerumen, bilateral: Secondary | ICD-10-CM | POA: Diagnosis not present

## 2021-08-03 ENCOUNTER — Emergency Department: Payer: 59

## 2021-08-03 ENCOUNTER — Other Ambulatory Visit: Payer: Self-pay

## 2021-08-03 ENCOUNTER — Other Ambulatory Visit
Admission: RE | Admit: 2021-08-03 | Discharge: 2021-08-03 | Disposition: A | Discharge status: Home / Self Care | Payer: 59 | Source: Ambulatory Visit | Attending: Family Medicine | Admitting: Family Medicine

## 2021-08-03 ENCOUNTER — Observation Stay: Payer: 59

## 2021-08-03 ENCOUNTER — Inpatient Hospital Stay
Admission: EM | Admit: 2021-08-03 | Discharge: 2021-08-12 | DRG: 175 | Disposition: A | Discharge status: Home / Self Care | Payer: 59 | Attending: Internal Medicine | Admitting: Internal Medicine

## 2021-08-03 DIAGNOSIS — E119 Type 2 diabetes mellitus without complications: Secondary | ICD-10-CM | POA: Diagnosis not present

## 2021-08-03 DIAGNOSIS — J069 Acute upper respiratory infection, unspecified: Secondary | ICD-10-CM | POA: Insufficient documentation

## 2021-08-03 DIAGNOSIS — Z8701 Personal history of pneumonia (recurrent): Secondary | ICD-10-CM

## 2021-08-03 DIAGNOSIS — Z886 Allergy status to analgesic agent status: Secondary | ICD-10-CM

## 2021-08-03 DIAGNOSIS — J180 Bronchopneumonia, unspecified organism: Secondary | ICD-10-CM | POA: Diagnosis present

## 2021-08-03 DIAGNOSIS — Z8249 Family history of ischemic heart disease and other diseases of the circulatory system: Secondary | ICD-10-CM

## 2021-08-03 DIAGNOSIS — F418 Other specified anxiety disorders: Secondary | ICD-10-CM

## 2021-08-03 DIAGNOSIS — J9811 Atelectasis: Secondary | ICD-10-CM | POA: Diagnosis present

## 2021-08-03 DIAGNOSIS — Z79899 Other long term (current) drug therapy: Secondary | ICD-10-CM

## 2021-08-03 DIAGNOSIS — I10 Essential (primary) hypertension: Secondary | ICD-10-CM | POA: Diagnosis present

## 2021-08-03 DIAGNOSIS — R0602 Shortness of breath: Secondary | ICD-10-CM

## 2021-08-03 DIAGNOSIS — D571 Sickle-cell disease without crisis: Secondary | ICD-10-CM | POA: Diagnosis present

## 2021-08-03 DIAGNOSIS — I2699 Other pulmonary embolism without acute cor pulmonale: Secondary | ICD-10-CM

## 2021-08-03 DIAGNOSIS — Z7984 Long term (current) use of oral hypoglycemic drugs: Secondary | ICD-10-CM

## 2021-08-03 DIAGNOSIS — Z20822 Contact with and (suspected) exposure to covid-19: Secondary | ICD-10-CM | POA: Diagnosis present

## 2021-08-03 DIAGNOSIS — R0600 Dyspnea, unspecified: Secondary | ICD-10-CM

## 2021-08-03 DIAGNOSIS — J90 Pleural effusion, not elsewhere classified: Secondary | ICD-10-CM | POA: Diagnosis present

## 2021-08-03 DIAGNOSIS — F32A Depression, unspecified: Secondary | ICD-10-CM | POA: Diagnosis present

## 2021-08-03 DIAGNOSIS — F419 Anxiety disorder, unspecified: Secondary | ICD-10-CM | POA: Diagnosis present

## 2021-08-03 DIAGNOSIS — R071 Chest pain on breathing: Secondary | ICD-10-CM

## 2021-08-03 DIAGNOSIS — R7989 Other specified abnormal findings of blood chemistry: Secondary | ICD-10-CM | POA: Diagnosis present

## 2021-08-03 DIAGNOSIS — I2694 Multiple subsegmental pulmonary emboli without acute cor pulmonale: Secondary | ICD-10-CM | POA: Diagnosis not present

## 2021-08-03 DIAGNOSIS — Z882 Allergy status to sulfonamides status: Secondary | ICD-10-CM

## 2021-08-03 DIAGNOSIS — E876 Hypokalemia: Secondary | ICD-10-CM | POA: Diagnosis present

## 2021-08-03 DIAGNOSIS — J9621 Acute and chronic respiratory failure with hypoxia: Secondary | ICD-10-CM | POA: Diagnosis present

## 2021-08-03 HISTORY — DX: Essential (primary) hypertension: I10

## 2021-08-03 LAB — RESP PANEL BY RT-PCR (FLU A&B, COVID) ARPGX2
Influenza A by PCR: NEGATIVE
Influenza B by PCR: NEGATIVE
SARS Coronavirus 2 by RT PCR: NEGATIVE

## 2021-08-03 LAB — CBC
HCT: 37 % — ABNORMAL LOW (ref 39.0–52.0)
Hemoglobin: 13.3 g/dL (ref 13.0–17.0)
MCH: 26.1 pg (ref 26.0–34.0)
MCHC: 35.9 g/dL (ref 30.0–36.0)
MCV: 72.5 fL — ABNORMAL LOW (ref 80.0–100.0)
Platelets: 201 10*3/uL (ref 150–400)
RBC: 5.1 MIL/uL (ref 4.22–5.81)
RDW: 15.4 % (ref 11.5–15.5)
WBC: 10.2 10*3/uL (ref 4.0–10.5)
nRBC: 0 % (ref 0.0–0.2)

## 2021-08-03 LAB — BASIC METABOLIC PANEL
Anion gap: 10 (ref 5–15)
BUN: 6 mg/dL (ref 6–20)
CO2: 26 mmol/L (ref 22–32)
Calcium: 9.6 mg/dL (ref 8.9–10.3)
Chloride: 98 mmol/L (ref 98–111)
Creatinine, Ser: 0.76 mg/dL (ref 0.61–1.24)
GFR, Estimated: >60 mL/min (ref >60)
Glucose, Bld: 311 mg/dL — ABNORMAL HIGH (ref 70–99)
Potassium: 3.8 mmol/L (ref 3.5–5.1)
Sodium: 134 mmol/L — ABNORMAL LOW (ref 135–145)

## 2021-08-03 LAB — CBG MONITORING, ED
Glucose-Capillary: 398 mg/dL — ABNORMAL HIGH (ref 70–99)
Glucose-Capillary: 285 mg/dL — ABNORMAL HIGH (ref 70–99)

## 2021-08-03 LAB — TROPONIN I (HIGH SENSITIVITY)
Troponin I (High Sensitivity): 3 ng/L (ref <18)
Troponin I (High Sensitivity): 4 ng/L (ref <18)

## 2021-08-03 LAB — APTT
aPTT: 34 seconds (ref 24–36)
aPTT: 71 seconds — ABNORMAL HIGH (ref 24–36)

## 2021-08-03 LAB — D-DIMER, QUANTITATIVE: D-Dimer, Quant: 1.36 ug/mL-FEU — ABNORMAL HIGH (ref 0.00–0.50)

## 2021-08-03 MED ORDER — ALBUTEROL SULFATE HFA 108 (90 BASE) MCG/ACT IN AERS
2.0000 | INHALATION_SPRAY | RESPIRATORY_TRACT | Status: DC | PRN
  Filled 2021-08-03: qty 6.7

## 2021-08-03 MED ORDER — HEPARIN BOLUS VIA INFUSION
4000.0000 [IU] | Freq: Once | INTRAVENOUS | Status: AC
  Administered 2021-08-03: 4000 [IU] via INTRAVENOUS
  Filled 2021-08-03: qty 4000

## 2021-08-03 MED ORDER — LISINOPRIL 5 MG PO TABS
5.0000 mg | ORAL_TABLET | Freq: Every day | ORAL | Status: DC
  Administered 2021-08-04 – 2021-08-07 (×4): 5 mg via ORAL
  Filled 2021-08-03 (×4): qty 1

## 2021-08-03 MED ORDER — INSULIN ASPART 100 UNIT/ML IJ SOLN
0.0000 [IU] | Freq: Every day | INTRAMUSCULAR | Status: DC
  Administered 2021-08-03: 3 [IU] via SUBCUTANEOUS
  Administered 2021-08-04 – 2021-08-05 (×2): 4 [IU] via SUBCUTANEOUS
  Administered 2021-08-09: 2 [IU] via SUBCUTANEOUS
  Filled 2021-08-03 (×5): qty 1

## 2021-08-03 MED ORDER — ONDANSETRON HCL 4 MG/2ML IJ SOLN
4.0000 mg | Freq: Three times a day (TID) | INTRAMUSCULAR | Status: DC | PRN
  Administered 2021-08-03 – 2021-08-05 (×2): 4 mg via INTRAVENOUS
  Filled 2021-08-03 (×2): qty 2

## 2021-08-03 MED ORDER — DM-GUAIFENESIN ER 30-600 MG PO TB12
1.0000 | ORAL_TABLET | Freq: Two times a day (BID) | ORAL | Status: DC | PRN
  Administered 2021-08-07 – 2021-08-10 (×3): 1 via ORAL
  Filled 2021-08-03 (×3): qty 1

## 2021-08-03 MED ORDER — SODIUM CHLORIDE 0.9 % IV SOLN: INTRAVENOUS | Status: DC

## 2021-08-03 MED ORDER — IOHEXOL 350 MG/ML SOLN
75.0000 mL | Freq: Once | INTRAVENOUS | Status: AC | PRN
  Administered 2021-08-03: 75 mL via INTRAVENOUS

## 2021-08-03 MED ORDER — INSULIN ASPART 100 UNIT/ML IJ SOLN
0.0000 [IU] | Freq: Three times a day (TID) | INTRAMUSCULAR | Status: DC
  Administered 2021-08-03: 9 [IU] via SUBCUTANEOUS
  Administered 2021-08-04 (×2): 7 [IU] via SUBCUTANEOUS
  Administered 2021-08-05 (×2): 9 [IU] via SUBCUTANEOUS
  Filled 2021-08-03 (×5): qty 1

## 2021-08-03 MED ORDER — FENTANYL CITRATE PF 50 MCG/ML IJ SOSY
50.0000 ug | PREFILLED_SYRINGE | INTRAMUSCULAR | Status: DC | PRN
  Administered 2021-08-03: 50 ug via INTRAVENOUS
  Filled 2021-08-03: qty 1

## 2021-08-03 MED ORDER — ACETAMINOPHEN 325 MG PO TABS
650.0000 mg | ORAL_TABLET | Freq: Four times a day (QID) | ORAL | Status: DC | PRN
  Administered 2021-08-04 – 2021-08-08 (×3): 650 mg via ORAL
  Filled 2021-08-03 (×3): qty 2

## 2021-08-03 MED ORDER — SODIUM CHLORIDE 0.9 % IV BOLUS
1000.0000 mL | Freq: Once | INTRAVENOUS | Status: AC
  Administered 2021-08-03: 1000 mL via INTRAVENOUS

## 2021-08-03 MED ORDER — MORPHINE SULFATE (PF) 2 MG/ML IV SOLN
2.0000 mg | INTRAVENOUS | Status: DC | PRN
  Administered 2021-08-03 – 2021-08-05 (×7): 2 mg via INTRAVENOUS
  Filled 2021-08-03 (×7): qty 1

## 2021-08-03 MED ORDER — OXYCODONE-ACETAMINOPHEN 5-325 MG PO TABS
1.0000 | ORAL_TABLET | ORAL | Status: DC | PRN
  Administered 2021-08-03 – 2021-08-10 (×10): 1 via ORAL
  Filled 2021-08-03 (×11): qty 1

## 2021-08-03 MED ORDER — SERTRALINE HCL 50 MG PO TABS
100.0000 mg | ORAL_TABLET | Freq: Every day | ORAL | Status: DC
  Administered 2021-08-03 – 2021-08-12 (×11): 100 mg via ORAL
  Filled 2021-08-03 (×11): qty 2

## 2021-08-03 MED ORDER — HYDRALAZINE HCL 20 MG/ML IJ SOLN: 5.0000 mg | INTRAMUSCULAR | Status: DC | PRN

## 2021-08-03 MED ORDER — APIXABAN 5 MG PO TABS
10.0000 mg | ORAL_TABLET | Freq: Once | ORAL | Status: AC
  Administered 2021-08-03: 10 mg via ORAL
  Filled 2021-08-03: qty 2

## 2021-08-03 MED ORDER — HEPARIN (PORCINE) 25000 UT/250ML-% IV SOLN
1700.0000 [IU]/h | INTRAVENOUS | Status: DC
  Administered 2021-08-03 – 2021-08-04 (×2): 1500 [IU]/h via INTRAVENOUS
  Filled 2021-08-03 (×2): qty 250

## 2021-08-03 MED ORDER — KETOROLAC TROMETHAMINE 30 MG/ML IJ SOLN
30.0000 mg | Freq: Once | INTRAMUSCULAR | Status: AC
  Administered 2021-08-03: 30 mg via INTRAVENOUS
  Filled 2021-08-03: qty 1

## 2021-08-03 NOTE — ED Provider Notes (Signed)
Emergency Medicine Provider Triage Evaluation Note  William Lin , a 50 y.o. male  was evaluated in triage.  Pt complains of pain and shortness of breath.  Patient with a history of sickle cell disease and type 2 diabetes, presents to the ED from the local urgent care.  He reports 2 days of cold symptoms including cough and congestion.  Patient presents with acute chest pain and was noted to have an elevated D-dimer from the urgent care.  He presents to the ED for emergent evaluation of his chest pain and shortness of breath..  Review of Systems  Positive: CP, SOB Negative: FCS  Physical Exam  BP 118/81   Pulse 98   Resp (!) 22   Ht 5\' 10"  (1.778 m)   Wt 99.8 kg   SpO2 96%   BMI 31.57 kg/m  Gen:   Awake, no distress  uncomfortable Resp:  Normal effort CTA MSK:   Moves extremities without difficulty  Other:   CVS: tachy rate  Medical Decision Making  Medically screening exam initiated at 12:20 PM.  Appropriate orders placed.  Edrick Whitehorn was informed that the remainder of the evaluation will be completed by another provider, this initial triage assessment does not replace that evaluation, and the importance of remaining in the ED until their evaluation is complete.  Patient with ED evaluation of acute chest pain and shortness of breath.  Patient presents with a reported elevated D-dimer from local urgent care.   Nelwyn Salisbury, PA-C 08/03/21 1221    08/05/21, MD 08/03/21 [phone]

## 2021-08-03 NOTE — ED Notes (Signed)
Pt alert  md at bedside in the hallway with family.  Pt states pain improved.  Iv in place.

## 2021-08-03 NOTE — ED Notes (Signed)
Report off to matthew rn cpod nurse.  Pt moved to cpod.

## 2021-08-03 NOTE — ED Notes (Signed)
Patient transported to CT 

## 2021-08-03 NOTE — ED Provider Notes (Signed)
[facility] Emergency Department Provider Note   ____________________________________________   Event Date/Time   First MD Initiated Contact with Patient 08/03/21 1227     (approximate)  I have reviewed the triage vital signs and the nursing notes.   HISTORY  Chief Complaint abnormal labs    HPI William Lin is a 50 y.o. male who presents for chest pain  LOCATION: Left chest DURATION: 1 day prior to arrival TIMING: Worsening since onset SEVERITY: 10/10 QUALITY: Sharp pain CONTEXT: Patient states that he has been having some upper respiratory symptoms including a cough over the past few days and was seen at his primary doctor's office with concern for possible upper respiratory infection and placed on antibiotics.  Patient states that on the ride home he began having worsening left-sided chest pain that was causing him to not be able to take deep breaths and presented to the emergency department.  After presenting the emergency department his physician called him back and told him that he had a positive D-dimer MODIFYING FACTORS: Pain is worsened with taking a deep breath and partially relieved at rest ASSOCIATED SYMPTOMS: Shortness of breath   Per medical record review, patient has history of sickle cell anemia          Past Medical History:  Diagnosis Date   Anxiety    Diabetes mellitus without complication (HCC)    Sickle cell anemia (HCC)     Patient Active Problem List   Diagnosis Date Noted   Pleural effusion 07/31/2011    Past Surgical History:  Procedure Laterality Date   CHOLECYSTECTOMY     DENTAL SURGERY     polynodial cyst     ROTATOR CUFF REPAIR     Left    Prior to Admission medications   Medication Sig Start Date End Date Taking? Authorizing Provider  acetaminophen (TYLENOL) 500 MG tablet Take 500 mg by mouth every 6 (six) hours as needed.    [provider]  FLUoxetine (PROZAC) 20 MG capsule Take 60 mg by  mouth daily.      [provider]  ibuprofen (ADVIL,MOTRIN) 200 MG tablet Take 400 mg by mouth every 6 (six) hours as needed.      [provider]  metFORMIN (GLUCOPHAGE) 500 MG tablet Take 500 mg by mouth daily after supper. To increase to 1000mg  daily after 10 days.    [provider]  sertraline (ZOLOFT) 100 MG tablet Take 100 mg by mouth daily.    [provider]    Allergies Aspirin and Sulfonamide derivatives  Family History  Problem Relation Age of Onset   Hypertension Other    Hypertension Mother    Arthritis Mother     Social History Social History   Tobacco Use   Smoking status: Never  Substance Use Topics   Alcohol use: No   Drug use: No    Review of Systems Constitutional: No fever/chills Eyes: No visual changes. ENT: No sore throat. Cardiovascular: Endorses chest pain. Respiratory: Endorses shortness of breath. Gastrointestinal: No abdominal pain.  No nausea, no vomiting.  No diarrhea. Genitourinary: Negative for dysuria. Musculoskeletal: Negative for acute arthralgias Skin: Negative for rash. Neurological: Negative for headaches, weakness/numbness/paresthesias in any extremity Psychiatric: Negative for suicidal ideation/homicidal ideation   ____________________________________________   PHYSICAL EXAM:  VITAL SIGNS: ED Triage Vitals  Enc Vitals Group     BP 08/03/21 1213 118/81     Pulse Rate 08/03/21 1213 98     Resp 08/03/21 1213 (!)  22     Temp 08/03/21 1354 98.6 F (37 C)     Temp Source 08/03/21 1354 Oral     SpO2 08/03/21 1213 96 %     Weight 08/03/21 1214 220 lb (99.8 kg)     Height 08/03/21 1214 5\' 10"  (1.778 m)     Head Circumference --      Peak Flow --      Pain Score 08/03/21 1214 10     Pain Loc --      Pain Edu? --      Excl. in GC? --    Constitutional: Alert and oriented. Well appearing and in no acute distress. Eyes: Conjunctivae are normal. PERRL. Head: Atraumatic. Nose: No  congestion/rhinnorhea. Mouth/Throat: Mucous membranes are moist. Neck: No stridor Cardiovascular: Grossly normal heart sounds.  Good peripheral circulation. Respiratory: Normal respiratory effort.  No retractions. Gastrointestinal: Soft and nontender. No distention. Musculoskeletal: No obvious deformities Neurologic:  Normal speech and language. No gross focal neurologic deficits are appreciated. Skin:  Skin is warm and dry. No rash noted. Psychiatric: Mood and affect are normal. Speech and behavior are normal. ____________________________________________   LABS (all labs ordered are listed, but only abnormal results are displayed)  Labs Reviewed  BASIC METABOLIC PANEL - Abnormal; Notable for the following components:      Result Value   Sodium 134 (*)    Glucose, Bld 311 (*)    All other components within normal limits  CBC - Abnormal; Notable for the following components:   HCT 37.0 (*)    MCV 72.5 (*)    All other components within normal limits  TROPONIN I (HIGH SENSITIVITY)  TROPONIN I (HIGH SENSITIVITY)   ____________________________________________  EKG  ED ECG REPORT I, 08/05/21, the attending physician, personally viewed and interpreted this ECG.  Date: 08/03/2021 EKG Time: 1207 Rate: 94 Rhythm: normal sinus rhythm QRS Axis: normal Intervals: normal ST/T Wave abnormalities: normal Narrative Interpretation: no evidence of acute ischemia  ____________________________________________  RADIOLOGY  ED MD interpretation: CT angiography of the chest shows pulmonary emboli of segmental and subsegmental left lower lobe  Official radiology report(s): CT Angio Chest PE W/Cm &/Or Wo Cm  Result Date: 08/03/2021 CLINICAL DATA:  Evaluate for pulmonary embolus EXAM: CT ANGIOGRAPHY CHEST WITH CONTRAST TECHNIQUE: Multidetector CT imaging of the chest was performed using the standard protocol during bolus administration of intravenous contrast. Multiplanar CT image  reconstructions and MIPs were obtained to evaluate the vascular anatomy. CONTRAST:  52mL OMNIPAQUE IOHEXOL 350 MG/ML SOLN COMPARISON:  Chest CT dated July 25, 2011 FINDINGS: Cardiovascular: Adequate opacification of the pulmonary arteries. Intraluminal filling defect of segmental and subsegmental left lower lobe pulmonary arteries. Normal heart size with no CT evidence of right heart strain. No pericardial effusion. No significant coronary artery calcifications or atherosclerotic disease of the thoracic aorta. Mediastinum/Nodes: Esophagus and thyroid are unremarkable. No pathologically enlarged lymph nodes seen in the chest. Lungs/Pleura: Central airways are patent. Left lower lobe linear opacities, likely due to scarring or atelectasis. No consolidation, pleural effusion or pneumothorax. Upper Abdomen: No acute abnormality. Musculoskeletal: No chest wall abnormality. No acute or significant osseous findings. Review of the MIP images confirms the above findings. IMPRESSION: Pulmonary embolus of segmental and subsegmental left lower lobe pulmonary arteries. No CT evidence of right heart strain. Critical Value/emergent results were called by telephone at the time of interpretation on 08/03/2021 at 1:58 pm to provider 08/05/2021, MD, who verbally acknowledged these results. Electronically Signed   By: Erma Heritage  Strickland M.D.   On: 08/03/2021 13:59    ____________________________________________   PROCEDURES  Procedure(s) performed (including Critical Care):  .1-3 Lead EKG Interpretation Performed by: Merwyn Katos, MD Authorized by: Merwyn Katos, MD     Interpretation: abnormal     ECG rate:  115   ECG rate assessment: tachycardic     Rhythm: sinus tachycardia     Ectopy: none     Conduction: normal    CRITICAL CARE Performed by: Merwyn Katos   Total critical care time: 35 minutes  Critical care time was exclusive of separately billable procedures and treating other patients.  Critical  care was necessary to treat or prevent imminent or life-threatening deterioration.  Critical care was time spent personally by me on the following activities: development of treatment plan with patient and/or surrogate as well as nursing, discussions with consultants, evaluation of patient's response to treatment, examination of patient, obtaining history from patient or surrogate, ordering and performing treatments and interventions, ordering and review of laboratory studies, ordering and review of radiographic studies, pulse oximetry and re-evaluation of patient's condition.  ____________________________________________   INITIAL IMPRESSION / ASSESSMENT AND PLAN / ED COURSE  As part of my medical decision making, I reviewed the following data within the electronic medical record, if available:  Nursing notes reviewed and incorporated, Labs reviewed, EKG interpreted, Old chart reviewed, Radiograph reviewed and Notes from prior ED visits reviewed and incorporated      Presentation concerning for pulmonary embolism. I have a lower suspicion but will still evaluate for ACS, PTX, PNA, Tamponade with labs, imaging, and continued monitoring and reassessments.  Findings: CTA shows Non-Massive Pulmonary Embolus Interventions: 10 mg Eliquis p.o. Disposition: Admit for continued telemetry monitoring, anticoagulation, and possible inpatient specialty consultation.      ____________________________________________   FINAL CLINICAL IMPRESSION(S) / ED DIAGNOSES  Final diagnoses:  Multiple subsegmental pulmonary emboli without acute cor pulmonale (HCC)  Shortness of breath  Chest pain on breathing     ED Discharge Orders     None        Note:  This document was prepared using Dragon voice recognition software and may include unintentional dictation errors.    Merwyn Katos, MD 08/04/21 [phone]

## 2021-08-03 NOTE — ED Triage Notes (Addendum)
Pt comes pov from walk in clinic with CP, cold symptoms. Has elevated dimer from walk in clinic. Clutching chest in pain. Also has sickle cell and T2D.

## 2021-08-03 NOTE — Consult Note (Signed)
ANTICOAGULATION CONSULT NOTE - Initial Consult  Pharmacy Consult for heparin Indication: pulmonary embolus  Allergies  Allergen Reactions   Aspirin    Sulfonamide Derivatives     Patient Measurements: Height: 5\' 10"  (177.8 cm) Weight: 99.8 kg (220 lb) IBW/kg (Calculated) : 73 Heparin Dosing Weight: 93.8kg  Vital Signs: Temp: 98.6 F (37 C) (10/19 1354) Temp Source: Oral (10/19 1354) BP: 162/104 (10/19 1452) Pulse Rate: 92 (10/19 1452)  Labs: Recent Labs    08/03/21 1219  HGB 13.3  HCT 37.0*  PLT 201  CREATININE 0.76  TROPONINIHS 3    Estimated Creatinine Clearance: 127.9 mL/min (by C-G formula based on SCr of 0.76 mg/dL).   Medical History: Past Medical History:  Diagnosis Date   Anxiety    Diabetes mellitus without complication (HCC)    Sickle cell anemia (HCC)     Medications:  Scheduled:   insulin aspart  0-5 Units Subcutaneous QHS   insulin aspart  0-9 Units Subcutaneous TID WC    Assessment: 50yo M with PMH of DM, anxiety, and sickle cell anemia who was admitted to the ED for abnormal labs. Pt D-dimer was 1.36 and CT scan showed PE. Pt was given one dose of apixaban, given possibility of procedure pt was switched to heparin. Pharmacy was consulted for heparin dosing.  Baseline CBC and aPTT were within normal limits Baseline PT/INR was ordered   No PTA anticoagulants noted on chart review   Goal of Therapy:  Heparin level 0.3-0.7 units/ml aPTT 66-102 seconds Monitor platelets by anticoagulation protocol: Yes   Plan:  Give 4000 units bolus x 1 Start heparin infusion at 1500 units/hr Check aPTT level in 6 hours and daily HL while on heparin Continue to monitor H&H and platelets with daily CBC while on heparin   50yo, PharmD Pharmacy Resident  08/03/2021 4:19 PM

## 2021-08-03 NOTE — ED Notes (Signed)
Called [facility] spoke to [place] for transfer to [facility]

## 2021-08-03 NOTE — H&P (Signed)
History and Physical    William Lin YQI:[id number] DOB: November 29, 1970 DOA: 08/03/2021  Referring MD/NP/PA:   PCP: [facility] [place]   Patient coming from:  The patient is coming from home.  At baseline, pt is independent for most of ADL.        Chief Complaint: chest pain  HPI: William Lin is a 50 y.o. male with medical history significant of hypertension, diabetes mellitus, depression, anxiety, pleural effusion, questionable sickle cell anemia (mild variant of sickle cell, no anemia, never had crisis per pt), who presents with chest pain.  Patient states that his chest pain started 2 days ago, which is located in the left central chest, throbing pain, initially 10 out of 10 severity, currently 1 out of 10 severity, radiating to the left shoulder.  Patient denies shortness of breath to me.  Patient has mild cough, no fever or chills.  Denies nausea, vomiting, diarrhea or abdominal pain.  No symptoms of UTI.  Patient states that he had long distance traveling recently.  He flew to [place] a week ago and [place] 1 month ago.  He had some discomfort in the left calf area, which had resolved a week ago. Pt was initially seen in UC and found to have positive D-dimer and was sent to ED for further evaluation and treatment.   ED Course: pt was found to have WBC 10.2, troponin level 3, D-dimer 1.36, electrolytes renal function okay, temperature normal, blood pressure 162/104, heart rate 106, RR 27, oxygen saturation 96% on room air.  Patient is placed on progressive bed for observation  CT angiogram of the chest showed: Pulmonary embolus of segmental and subsegmental left lower lobe pulmonary arteries. No CT evidence of right heart strain.    Review of Systems:   General: no fevers, chills, no body weight gain, has fatigue HEENT: no blurry vision, hearing changes or sore throat Respiratory: no dyspnea, has coughing, no wheezing CV: has chest pain, no palpitations GI: no  nausea, vomiting, abdominal pain, diarrhea, constipation GU: no dysuria, burning on urination, increased urinary frequency, hematuria  Ext: no leg edema Neuro: no unilateral weakness, numbness, or tingling, no vision change or hearing loss Skin: no rash, no skin tear. MSK: No muscle spasm, no deformity, no limitation of range of movement in spin Heme: No easy bruising.  Travel history: No recent long distant travel.  Allergy:  Allergies  Allergen Reactions   Aspirin    Sulfonamide Derivatives     Past Medical History:  Diagnosis Date   Anxiety    Diabetes mellitus without complication (HCC)    HTN (hypertension)    Sickle cell anemia (HCC)     Past Surgical History:  Procedure Laterality Date   CHOLECYSTECTOMY     DENTAL SURGERY     polynodial cyst     ROTATOR CUFF REPAIR     Left    Social History:  reports that he has never smoked. He has never used smokeless tobacco. He reports that he does not drink alcohol and does not use drugs.  Family History:  Family History  Problem Relation Age of Onset   Hypertension Other    Hypertension Mother    Arthritis Mother      Prior to Admission medications   Medication Sig Start Date End Date Taking? Authorizing Provider  acetaminophen (TYLENOL) 500 MG tablet Take 500 mg by mouth every 6 (six) hours as needed.   Yes [provider]  ibuprofen (ADVIL,MOTRIN) 200 MG  tablet Take 400 mg by mouth every 6 (six) hours as needed.     Yes [provider]  lisinopril (ZESTRIL) 5 MG tablet Take 5 mg by mouth daily. 07/30/21  Yes [provider]  metFORMIN (GLUCOPHAGE-XR) 500 MG 24 hr tablet Take 2 tablets by mouth in the morning and at bedtime. 08/12/20  Yes [provider]  pioglitazone (ACTOS) 15 MG tablet Take 15 mg by mouth daily. 07/31/21  Yes [provider]  sertraline (ZOLOFT) 100 MG tablet Take 100 mg by mouth daily.   Yes [provider]  FLUoxetine (PROZAC) 20 MG capsule  Take 60 mg by mouth daily.   Patient not taking: No sig reported    [provider]  metFORMIN (GLUCOPHAGE) 500 MG tablet Take 500 mg by mouth daily after supper. To increase to 1000mg  daily after 10 days. Patient not taking: No sig reported    [provider]    Physical Exam: Vitals:   08/03/21 1354 08/03/21 1418 08/03/21 1452 08/03/21 1527  BP: 125/73 (!) 162/104 (!) 162/104 128/79  Pulse: 94 (!) 106 92 78  Resp: (!) 22 (!) 27 (!) 22 20  Temp: 98.6 F (37 C)     TempSrc: Oral     SpO2: 97% 96% 96% 99%  Weight:      Height:       General: Not in acute distress HEENT:       Eyes: PERRL, EOMI, no scleral icterus.       ENT: No discharge from the ears and nose, no pharynx injection, no tonsillar enlargement.        Neck: No JVD, no bruit, no mass felt. Heme: No neck lymph node enlargement. Cardiac: S1/S2, RRR, No murmurs, No gallops or rubs. Respiratory: No rales, wheezing, rhonchi or rubs. GI: Soft, nondistended, nontender, no rebound pain, no organomegaly, BS present. GU: No hematuria Ext: No pitting leg edema bilaterally. 1+DP/PT pulse bilaterally. Musculoskeletal: No joint deformities, No joint redness or warmth, no limitation of ROM in spin. Skin: No rashes.  Neuro: Alert, oriented X3, cranial nerves II-XII grossly intact, moves all extremities normally.  Psych: Patient is not psychotic, no suicidal or hemocidal ideation.  Labs on Admission: I have personally reviewed following labs and imaging studies  CBC: Recent Labs  Lab 08/03/21 1219  WBC 10.2  HGB 13.3  HCT 37.0*  MCV 72.5*  PLT 201   Basic Metabolic Panel: Recent Labs  Lab 08/03/21 1219  NA 134*  K 3.8  CL 98  CO2 26  GLUCOSE 311*  BUN 6  CREATININE 0.76  CALCIUM 9.6   GFR: Estimated Creatinine Clearance: 127.9 mL/min (by C-G formula based on SCr of 0.76 mg/dL). Liver Function Tests: No results for input(s): AST, ALT, ALKPHOS, BILITOT, PROT, ALBUMIN in the last 168 hours. No  results for input(s): LIPASE, AMYLASE in the last 168 hours. No results for input(s): AMMONIA in the last 168 hours. Coagulation Profile: No results for input(s): INR, PROTIME in the last 168 hours. Cardiac Enzymes: No results for input(s): CKTOTAL, CKMB, CKMBINDEX, TROPONINI in the last 168 hours. BNP (last 3 results) No results for input(s): PROBNP in the last 8760 hours. HbA1C: No results for input(s): HGBA1C in the last 72 hours. CBG: No results for input(s): GLUCAP in the last 168 hours. Lipid Profile: No results for input(s): CHOL, HDL, LDLCALC, TRIG, CHOLHDL, LDLDIRECT in the last 72 hours. Thyroid Function Tests: No results for input(s): TSH, T4TOTAL, FREET4, T3FREE, THYROIDAB in the last  72 hours. Anemia Panel: No results for input(s): VITAMINB12, FOLATE, FERRITIN, TIBC, IRON, RETICCTPCT in the last 72 hours. Urine analysis:    Component Value Date/Time   COLORURINE YELLOW 10/16/2009 2206   APPEARANCEUR CLEAR 10/16/2009 2206   LABSPEC 1.019 10/16/2009 2206   PHURINE 6.0 10/16/2009 2206   GLUCOSEU NEGATIVE 10/16/2009 2206   HGBUR NEGATIVE 10/16/2009 2206   BILIRUBINUR NEGATIVE 10/16/2009 2206   KETONESUR NEGATIVE 10/16/2009 2206   PROTEINUR NEGATIVE 10/16/2009 2206   UROBILINOGEN 1.0 10/16/2009 2206   NITRITE NEGATIVE 10/16/2009 2206   LEUKOCYTESUR  10/16/2009 2206    NEGATIVE MICROSCOPIC NOT DONE ON URINES WITH NEGATIVE PROTEIN, BLOOD, LEUKOCYTES, NITRITE, OR GLUCOSE <1000 mg/dL.   Sepsis Labs: @LABRCNTIP (procalcitonin:4,lacticidven:4) ) Recent Results (from the past 240 hour(s))  Resp Panel by RT-PCR (Flu A&B, Covid) Nasopharyngeal Swab     Status: None   Collection Time: 08/03/21  3:25 PM   Specimen: Nasopharyngeal Swab; Nasopharyngeal(NP) swabs in vial transport medium  Result Value Ref Range Status   SARS Coronavirus 2 by RT PCR NEGATIVE NEGATIVE Final    Comment: (NOTE) SARS-CoV-2 target nucleic acids are NOT DETECTED.  The SARS-CoV-2 RNA is generally  detectable in upper respiratory specimens during the acute phase of infection. The lowest concentration of SARS-CoV-2 viral copies this assay can detect is 138 copies/mL. A negative result does not preclude SARS-Cov-2 infection and should not be used as the sole basis for treatment or other patient management decisions. A negative result may occur with  improper specimen collection/handling, submission of specimen other than nasopharyngeal swab, presence of viral mutation(s) within the areas targeted by this assay, and inadequate number of viral copies(<138 copies/mL). A negative result must be combined with clinical observations, patient history, and epidemiological information. The expected result is Negative.  Fact Sheet for Patients:  08/05/21  Fact Sheet for Healthcare Providers:  BloggerCourse.com  This test is no t yet approved or cleared by the [employer] FDA and  has been authorized for detection and/or diagnosis of SARS-CoV-2 by FDA under an Emergency Use Authorization (EUA). This EUA will remain  in effect (meaning this test can be used) for the duration of the COVID-19 declaration under Section 564(b)(1) of the Act, 21 U.S.C.section 360bbb-3(b)(1), unless the authorization is terminated  or revoked sooner.       Influenza A by PCR NEGATIVE NEGATIVE Final   Influenza B by PCR NEGATIVE NEGATIVE Final    Comment: (NOTE) The Xpert Xpress SARS-CoV-2/FLU/RSV plus assay is intended as an aid in the diagnosis of influenza from Nasopharyngeal swab specimens and should not be used as a sole basis for treatment. Nasal washings and aspirates are unacceptable for Xpert Xpress SARS-CoV-2/FLU/RSV testing.  Fact Sheet for Patients: [place]  Fact Sheet for Healthcare Providers: BloggerCourse.com  This test is not yet approved or cleared by the [employer] FDA  and has been authorized for detection and/or diagnosis of SARS-CoV-2 by FDA under an Emergency Use Authorization (EUA). This EUA will remain in effect (meaning this test can be used) for the duration of the COVID-19 declaration under Section 564(b)(1) of the Act, 21 U.S.C. section 360bbb-3(b)(1), unless the authorization is terminated or revoked.  Performed at [facility], [address] [place], [place]      Radiological Exams on Admission: CT Angio Chest PE W/Cm &/Or Wo Cm  Result Date: 08/03/2021 CLINICAL DATA:  Evaluate for pulmonary embolus EXAM: CT ANGIOGRAPHY CHEST WITH CONTRAST TECHNIQUE: Multidetector CT imaging of the chest was performed using  the standard protocol during bolus administration of intravenous contrast. Multiplanar CT image reconstructions and MIPs were obtained to evaluate the vascular anatomy. CONTRAST:  74mL OMNIPAQUE IOHEXOL 350 MG/ML SOLN COMPARISON:  Chest CT dated July 25, 2011 FINDINGS: Cardiovascular: Adequate opacification of the pulmonary arteries. Intraluminal filling defect of segmental and subsegmental left lower lobe pulmonary arteries. Normal heart size with no CT evidence of right heart strain. No pericardial effusion. No significant coronary artery calcifications or atherosclerotic disease of the thoracic aorta. Mediastinum/Nodes: Esophagus and thyroid are unremarkable. No pathologically enlarged lymph nodes seen in the chest. Lungs/Pleura: Central airways are patent. Left lower lobe linear opacities, likely due to scarring or atelectasis. No consolidation, pleural effusion or pneumothorax. Upper Abdomen: No acute abnormality. Musculoskeletal: No chest wall abnormality. No acute or significant osseous findings. Review of the MIP images confirms the above findings. IMPRESSION: Pulmonary embolus of segmental and subsegmental left lower lobe pulmonary arteries. No CT evidence of right heart strain. Critical Value/emergent results were  called by telephone at the time of interpretation on 08/03/2021 at 1:58 pm to provider Erma Heritage, MD, who verbally acknowledged these results. Electronically Signed   By: Allegra Lai M.D.   On: 08/03/2021 13:59     EKG: I have personally reviewed.  Sinus rhythm, QTC 440, LAE, nonspecific to change  Assessment/Plan Principal Problem:   Acute pulmonary embolism (HCC) Active Problems:   Diabetes mellitus without complication (HCC)   Depression with anxiety   HTN (hypertension)   Acute pulmonary embolism (HCC): CT angiogram of the chest showed pulmonary embolus of segmental and subsegmental left lower lobe pulmonary arteries. No CT evidence of right heart strain.  No oxygen desaturation.  Currently hemodynamically stable.  Patient received 1 dose of Eliquis, will need to get lower extremity venous Doppler to r/o DVT.  In case patient needs procedure, will switch patient to IV heparin now.   -Will place on progressive bed for obs -heparin drip initiated -LE dopplers ordered to evaluate for DVT -pain control: When necessary Percocet and morphine -prn albuterol and mucinex   Diabetes mellitus without complication (HCC): No A1c on record.  Patient taking metformin and Actos at home.  Blood sugar 311.  Anion gap 10. -Start sliding scale insulin  Depression with anxiety -Continue home Zoloft  HTN (hypertension) -IV hydralazine as needed -Continue home lisinopril  Questionable sickle cell anemia: pt states that he may have mild variant of sickle cell, no anemia, never had crisis.  His Hgb is 13.3 today. -observe closely.     DVT ppx: on IV Heparin     Code Status: Full code Family Communication:   Yes, patient's   wife at bed side Disposition Plan:  Anticipate discharge back to previous environment Consults called:  none Admission status and Level of care: Progressive Cardiac:    obs        Status is: Observation  The patient remains OBS appropriate and will d/c before 2  midnights.         Date of Service 08/03/2021    Lorretta Harp [facility]   If 7PM-7AM, please contact night-coverage [email or web address] 08/03/2021, 4:48 PM

## 2021-08-03 NOTE — Consult Note (Signed)
ANTICOAGULATION CONSULT NOTE  Pharmacy Consult for heparin Indication: pulmonary embolus  Allergies  Allergen Reactions   Aspirin    Sulfonamide Derivatives     Patient Measurements: Height: 5\' 10"  (177.8 cm) Weight: 99.8 kg (220 lb) IBW/kg (Calculated) : 73 Heparin Dosing Weight: 93.8kg  Vital Signs: Temp: 98.7 F (37.1 C) (10/19 1758) Temp Source: Oral (10/19 1758) BP: 137/90 (10/19 2030) Pulse Rate: 100 (10/19 2030)  Labs: Recent Labs    08/03/21 1219 08/03/21 1455 08/03/21 1530 08/03/21 2215  HGB 13.3  --   --   --   HCT 37.0*  --   --   --   PLT 201  --   --   --   APTT  --   --  34 71*  CREATININE 0.76  --   --   --   TROPONINIHS 3 4  --   --      Estimated Creatinine Clearance: 127.9 mL/min (by C-G formula based on SCr of 0.76 mg/dL).   Medical History: Past Medical History:  Diagnosis Date   Anxiety    Diabetes mellitus without complication (HCC)    HTN (hypertension)    Sickle cell anemia (HCC)     Medications:  Scheduled:   insulin aspart  0-5 Units Subcutaneous QHS   insulin aspart  0-9 Units Subcutaneous TID WC   [START ON 08/04/2021] lisinopril  5 mg Oral Daily   sertraline  100 mg Oral Daily    Assessment: 50yo M with PMH of DM, anxiety, and sickle cell anemia who was admitted to the ED for abnormal labs. Pt D-dimer was 1.36 and CT scan showed PE. Pt was given one dose of apixaban, given possibility of procedure pt was switched to heparin. Pharmacy was consulted for heparin dosing.  Baseline CBC and aPTT were within normal limits Baseline PT/INR was ordered   No PTA anticoagulants noted on chart review   Goal of Therapy:  Heparin level 0.3-0.7 units/ml aPTT 66-102 seconds Monitor platelets by anticoagulation protocol: Yes  10/19 2215 aPTT 71, therapeutic x 1   Plan:  Continue heparin infusion at 1500 units/hr Recheck aPTT level with AM labs to confirm Daily HL while on heparin Continue to monitor H&H and platelets with daily  CBC while on heparin   2216, PharmD, [facility] 08/03/2021 11:19 PM

## 2021-08-04 DIAGNOSIS — I2699 Other pulmonary embolism without acute cor pulmonale: Secondary | ICD-10-CM | POA: Diagnosis not present

## 2021-08-04 LAB — CBG MONITORING, ED
Glucose-Capillary: 343 mg/dL — ABNORMAL HIGH (ref 70–99)
Glucose-Capillary: 344 mg/dL — ABNORMAL HIGH (ref 70–99)

## 2021-08-04 LAB — CBC
HCT: 34.6 % — ABNORMAL LOW (ref 39.0–52.0)
Hemoglobin: 12.2 g/dL — ABNORMAL LOW (ref 13.0–17.0)
MCH: 25.7 pg — ABNORMAL LOW (ref 26.0–34.0)
MCHC: 35.3 g/dL (ref 30.0–36.0)
MCV: 72.8 fL — ABNORMAL LOW (ref 80.0–100.0)
Platelets: 189 10*3/uL (ref 150–400)
RBC: 4.75 MIL/uL (ref 4.22–5.81)
RDW: 15.6 % — ABNORMAL HIGH (ref 11.5–15.5)
WBC: 16.6 10*3/uL — ABNORMAL HIGH (ref 4.0–10.5)
nRBC: 0 % (ref 0.0–0.2)

## 2021-08-04 LAB — HEMOGLOBIN A1C
Hgb A1c MFr Bld: 6.8 % — ABNORMAL HIGH (ref 4.8–5.6)
Mean Plasma Glucose: 148 mg/dL

## 2021-08-04 LAB — HEPARIN LEVEL (UNFRACTIONATED): Heparin Unfractionated: >1.1 IU/mL — ABNORMAL HIGH (ref 0.30–0.70)

## 2021-08-04 LAB — APTT
aPTT: 63 seconds — ABNORMAL HIGH (ref 24–36)
aPTT: 35 seconds (ref 24–36)

## 2021-08-04 LAB — HIV ANTIBODY (ROUTINE TESTING W REFLEX): HIV Screen 4th Generation wRfx: NONREACTIVE

## 2021-08-04 MED ORDER — KETOROLAC TROMETHAMINE 30 MG/ML IJ SOLN
30.0000 mg | Freq: Once | INTRAMUSCULAR | Status: AC
  Administered 2021-08-04: 30 mg via INTRAVENOUS
  Filled 2021-08-04: qty 1

## 2021-08-04 MED ORDER — APIXABAN 5 MG PO TABS
10.0000 mg | ORAL_TABLET | Freq: Two times a day (BID) | ORAL | Status: AC
  Administered 2021-08-04 – 2021-08-10 (×14): 10 mg via ORAL
  Filled 2021-08-04 (×14): qty 2

## 2021-08-04 MED ORDER — APIXABAN 5 MG PO TABS
5.0000 mg | ORAL_TABLET | Freq: Two times a day (BID) | ORAL | Status: DC
  Administered 2021-08-11 – 2021-08-12 (×3): 5 mg via ORAL
  Filled 2021-08-04 (×3): qty 1

## 2021-08-04 MED ORDER — INSULIN GLARGINE-YFGN 100 UNIT/ML ~~LOC~~ SOLN
15.0000 [IU] | Freq: Every day | SUBCUTANEOUS | Status: DC
  Administered 2021-08-04: 15 [IU] via SUBCUTANEOUS
  Filled 2021-08-04 (×2): qty 0.15

## 2021-08-04 MED ORDER — HEPARIN BOLUS VIA INFUSION
1400.0000 [IU] | Freq: Once | INTRAVENOUS | Status: AC
  Administered 2021-08-04: 1400 [IU] via INTRAVENOUS
  Filled 2021-08-04: qty 1400

## 2021-08-04 MED ORDER — KETOROLAC TROMETHAMINE 15 MG/ML IJ SOLN
15.0000 mg | Freq: Four times a day (QID) | INTRAMUSCULAR | Status: DC | PRN
  Filled 2021-08-04 (×2): qty 1

## 2021-08-04 MED ORDER — ALPRAZOLAM 0.25 MG PO TABS
0.2500 mg | ORAL_TABLET | Freq: Once | ORAL | Status: AC
  Administered 2021-08-04: 0.25 mg via ORAL
  Filled 2021-08-04: qty 1

## 2021-08-04 NOTE — ED Notes (Signed)
Secure chat sent to [facility] for this pt's status

## 2021-08-04 NOTE — Progress Notes (Signed)
PROGRESS NOTE    William Lin  WGN:[id number] DOB: 11-18-1970 DOA: 08/03/2021  PCP: [facility] [place]   Brief Narrative: This 50 years old male with PMH significant for hypertension, diabetes, depression, anxiety,  questionable history of sickle cell anemia ( mild variant no anemia never had any crisis ) per patient presented in the ED with chest pain. Patient reports long distance travel recently.  He flew to [place] a week ago and then [place] a month ago.  Patient also reported discomfort in the left calf area which has resolved a week ago.  Patient was found to have elevated D-dimer and was sent in the ED.  CTA chest confirms pulmonary embolism.  Patient is started on heparin.  Assessment & Plan:   Principal Problem:   Acute pulmonary embolism (HCC) Active Problems:   Diabetes mellitus without complication (HCC)   Depression with anxiety   HTN (hypertension)  Acute pulmonary embolism: Presented with pleuritic chest pain. Patient reports recent long flight to [place] and [place]. Elevated D-dimer, CTA chest confirms pulmonary embolism. There is no CT evidence of right heart strain. Patient is not hypoxic,  remains hemodynamically stable. Lower extremity venous duplex negative for DVT.   Patient started on heparin, subsequently transitioned on Eliquis. Continue pain control with morphine and Percocet.  Diabetes mellitus: Patient taking metformin and Actos at home. Hold home diabetic medications. Start regular sliding scale.  Depression anxiety: Continue Zoloft  Hypertension: Continue home lisinopril  Sickle cell trait: Hemoglobin remained stable.  Leukocytosis: Could be reactive. Continue to monitor WBC    DVT prophylaxis:  heparin gtt Code Status: full code Family Communication: wife at bed side. Disposition Plan:     Status is: Observation  The patient remains OBS appropriate and will d/c before 2 midnights.  Anticipated  discharge home.  Tomorrow   Consultants:   None.  Procedures:  CTA chest. Antimicrobials:  None  Subjective: Patient is seen and examined at bedside.  Overnight events noted. Patient reports feeling better, still reports having chest pain, denies any shortness of breath.  Objective: Vitals:   08/04/21 1000 08/04/21 1300 08/04/21 1330 08/04/21 1400  BP: 137/84 122/75 (!) 134/92 118/82  Pulse: (!) 110 (!) 103 (!) 127 (!) 113  Resp: (!) 27 19 (!) 33 (!) 29  Temp:      TempSrc:      SpO2: 98% 90% (!) 84% 91%  Weight:      Height:        Intake/Output Summary (Last 24 hours) at 08/04/2021 1550 Last data filed at 08/03/2021 2014 Gross per 24 hour  Intake 900 ml  Output --  Net 900 ml   Filed Weights   08/03/21 1214  Weight: 99.8 kg    Examination:  General exam: Appears comfortable, not in any acute distress. Respiratory system: Clear to auscultation. Respiratory effort normal.  RR 15 Cardiovascular system: S1-S2 heard, regular rate and rhythm, no murmur. Gastrointestinal system: Abdomen is soft, nontender, nondistended, BS + Central nervous system: Alert and oriented. No focal neurological deficits. Extremities: Symmetric 5 x 5 power. Skin: No rashes, lesions or ulcers Psychiatry: Judgement and insight appear normal. Mood & affect appropriate.     Data Reviewed: I have personally reviewed following labs and imaging studies  CBC: Recent Labs  Lab 08/03/21 1219 08/04/21 0437  WBC 10.2 16.6*  HGB 13.3 12.2*  HCT 37.0* 34.6*  MCV 72.5* 72.8*  PLT 201 189   Basic Metabolic Panel: Recent Labs  Lab 08/03/21  1219  NA 134*  K 3.8  CL 98  CO2 26  GLUCOSE 311*  BUN 6  CREATININE 0.76  CALCIUM 9.6   GFR: Estimated Creatinine Clearance: 127.9 mL/min (by C-G formula based on SCr of 0.76 mg/dL). Liver Function Tests: No results for input(s): AST, ALT, ALKPHOS, BILITOT, PROT, ALBUMIN in the last 168 hours. No results for input(s): LIPASE, AMYLASE in the  last 168 hours. No results for input(s): AMMONIA in the last 168 hours. Coagulation Profile: No results for input(s): INR, PROTIME in the last 168 hours. Cardiac Enzymes: No results for input(s): CKTOTAL, CKMB, CKMBINDEX, TROPONINI in the last 168 hours. BNP (last 3 results) No results for input(s): PROBNP in the last 8760 hours. HbA1C: No results for input(s): HGBA1C in the last 72 hours. CBG: Recent Labs  Lab 08/03/21 1823 08/03/21 2220 08/04/21 0835  GLUCAP 398* 285* 343*   Lipid Profile: No results for input(s): CHOL, HDL, LDLCALC, TRIG, CHOLHDL, LDLDIRECT in the last 72 hours. Thyroid Function Tests: No results for input(s): TSH, T4TOTAL, FREET4, T3FREE, THYROIDAB in the last 72 hours. Anemia Panel: No results for input(s): VITAMINB12, FOLATE, FERRITIN, TIBC, IRON, RETICCTPCT in the last 72 hours. Sepsis Labs: No results for input(s): PROCALCITON, LATICACIDVEN in the last 168 hours.  Recent Results (from the past 240 hour(s))  Resp Panel by RT-PCR (Flu A&B, Covid) Nasopharyngeal Swab     Status: None   Collection Time: 08/03/21  3:25 PM   Specimen: Nasopharyngeal Swab; Nasopharyngeal(NP) swabs in vial transport medium  Result Value Ref Range Status   SARS Coronavirus 2 by RT PCR NEGATIVE NEGATIVE Final    Comment: (NOTE) SARS-CoV-2 target nucleic acids are NOT DETECTED.  The SARS-CoV-2 RNA is generally detectable in upper respiratory specimens during the acute phase of infection. The lowest concentration of SARS-CoV-2 viral copies this assay can detect is 138 copies/mL. A negative result does not preclude SARS-Cov-2 infection and should not be used as the sole basis for treatment or other patient management decisions. A negative result may occur with  improper specimen collection/handling, submission of specimen other than nasopharyngeal swab, presence of viral mutation(s) within the areas targeted by this assay, and inadequate number of viral copies(<138 copies/mL).  A negative result must be combined with clinical observations, patient history, and epidemiological information. The expected result is Negative.  Fact Sheet for Patients:  BloggerCourse.com  Fact Sheet for Healthcare Providers:  SeriousBroker.it  This test is no t yet approved or cleared by the [place] FDA and  has been authorized for detection and/or diagnosis of SARS-CoV-2 by FDA under an Emergency Use Authorization (EUA). This EUA will remain  in effect (meaning this test can be used) for the duration of the COVID-19 declaration under Section 564(b)(1) of the Act, 21 U.S.C.section 360bbb-3(b)(1), unless the authorization is terminated  or revoked sooner.       Influenza A by PCR NEGATIVE NEGATIVE Final   Influenza B by PCR NEGATIVE NEGATIVE Final    Comment: (NOTE) The Xpert Xpress SARS-CoV-2/FLU/RSV plus assay is intended as an aid in the diagnosis of influenza from Nasopharyngeal swab specimens and should not be used as a sole basis for treatment. Nasal washings and aspirates are unacceptable for Xpert Xpress SARS-CoV-2/FLU/RSV testing.  Fact Sheet for Patients: BloggerCourse.com  Fact Sheet for Healthcare Providers: SeriousBroker.it  This test is not yet approved or cleared by the [place] FDA and has been authorized for detection and/or diagnosis of SARS-CoV-2 by FDA under an Emergency Use Authorization (EUA).  This EUA will remain in effect (meaning this test can be used) for the duration of the COVID-19 declaration under Section 564(b)(1) of the Act, 21 U.S.C. section 360bbb-3(b)(1), unless the authorization is terminated or revoked.  Performed at [facility], [address] [place], [place] [address]     Radiology Studies: CT Angio Chest PE W/Cm &/Or Wo Cm  Result Date: 08/03/2021 CLINICAL DATA:  Evaluate for pulmonary embolus EXAM: CT  ANGIOGRAPHY CHEST WITH CONTRAST TECHNIQUE: Multidetector CT imaging of the chest was performed using the standard protocol during bolus administration of intravenous contrast. Multiplanar CT image reconstructions and MIPs were obtained to evaluate the vascular anatomy. CONTRAST:  47mL OMNIPAQUE IOHEXOL 350 MG/ML SOLN COMPARISON:  Chest CT dated July 25, 2011 FINDINGS: Cardiovascular: Adequate opacification of the pulmonary arteries. Intraluminal filling defect of segmental and subsegmental left lower lobe pulmonary arteries. Normal heart size with no CT evidence of right heart strain. No pericardial effusion. No significant coronary artery calcifications or atherosclerotic disease of the thoracic aorta. Mediastinum/Nodes: Esophagus and thyroid are unremarkable. No pathologically enlarged lymph nodes seen in the chest. Lungs/Pleura: Central airways are patent. Left lower lobe linear opacities, likely due to scarring or atelectasis. No consolidation, pleural effusion or pneumothorax. Upper Abdomen: No acute abnormality. Musculoskeletal: No chest wall abnormality. No acute or significant osseous findings. Review of the MIP images confirms the above findings. IMPRESSION: Pulmonary embolus of segmental and subsegmental left lower lobe pulmonary arteries. No CT evidence of right heart strain. Critical Value/emergent results were called by telephone at the time of interpretation on 08/03/2021 at 1:58 pm to provider Erma Heritage, MD, who verbally acknowledged these results. Electronically Signed   By: Allegra Lai M.D.   On: 08/03/2021 13:59   US Venous Img Lower Bilateral (DVT)  Result Date: 08/03/2021 CLINICAL DATA:  Pulmonary embolism. EXAM: BILATERAL LOWER EXTREMITY VENOUS DOPPLER ULTRASOUND TECHNIQUE: Gray-scale sonography with graded compression, as well as color Doppler and duplex ultrasound were performed to evaluate the lower extremity deep venous systems from the level of the common femoral vein and including  the common femoral, femoral, profunda femoral, popliteal and calf veins including the posterior tibial, peroneal and gastrocnemius veins when visible. The superficial great saphenous vein was also interrogated. Spectral Doppler was utilized to evaluate flow at rest and with distal augmentation maneuvers in the common femoral, femoral and popliteal veins. COMPARISON:  None. FINDINGS: RIGHT LOWER EXTREMITY Common Femoral Vein: No evidence of thrombus. Normal compressibility, respiratory phasicity and response to augmentation. Saphenofemoral Junction: No evidence of thrombus. Normal compressibility and flow on color Doppler imaging. Profunda Femoral Vein: No evidence of thrombus. Normal compressibility and flow on color Doppler imaging. Femoral Vein: No evidence of thrombus. Normal compressibility, respiratory phasicity and response to augmentation. Popliteal Vein: No evidence of thrombus. Normal compressibility, respiratory phasicity and response to augmentation. Calf Veins: No evidence of thrombus. Normal compressibility and flow on color Doppler imaging. Superficial Great Saphenous Vein: No evidence of thrombus. Normal compressibility. Venous Reflux:  None. Other Findings: No evidence of superficial thrombophlebitis or abnormal fluid collection. LEFT LOWER EXTREMITY Common Femoral Vein: No evidence of thrombus. Normal compressibility, respiratory phasicity and response to augmentation. Saphenofemoral Junction: No evidence of thrombus. Normal compressibility and flow on color Doppler imaging. Profunda Femoral Vein: No evidence of thrombus. Normal compressibility and flow on color Doppler imaging. Femoral Vein: No evidence of thrombus. Normal compressibility, respiratory phasicity and response to augmentation. Popliteal Vein: No evidence of thrombus. Normal compressibility, respiratory phasicity and response to augmentation. Calf Veins:  No evidence of thrombus. Normal compressibility and flow on color Doppler imaging.  Superficial Great Saphenous Vein: No evidence of thrombus. Normal compressibility. Venous Reflux:  None. Other Findings: There is thrombus noted in the left short saphenous vein behind the knee and extending into the calf. This thrombus does not visibly extend into the left popliteal vein. IMPRESSION: No evidence of deep venous thrombosis in either lower extremity. Superficial thrombophlebitis noted in the left short saphenous vein. Electronically Signed   By: Irish Lack M.D.   On: 08/03/2021 16:59     Scheduled Meds:  apixaban  10 mg Oral BID   Followed by   Melene Muller ON 08/11/2021] apixaban  5 mg Oral BID   insulin aspart  0-5 Units Subcutaneous QHS   insulin aspart  0-9 Units Subcutaneous TID WC   insulin glargine-yfgn  15 Units Subcutaneous Daily   lisinopril  5 mg Oral Daily   sertraline  100 mg Oral Daily   Continuous Infusions:  sodium chloride       LOS: 0 days    Time spent: 35 min    Goldia Ligman, MD [facility]   If 7PM-7AM, please contact night-coverage

## 2021-08-04 NOTE — ED Notes (Signed)
BGL 315

## 2021-08-04 NOTE — Consult Note (Addendum)
ANTICOAGULATION CONSULT NOTE  Pharmacy Consult for Apixaban Dosing Indication: pulmonary embolus  Allergies  Allergen Reactions   Aspirin    Sulfonamide Derivatives     Patient Measurements: Height: 5\' 10"  (177.8 cm) Weight: 99.8 kg (220 lb) IBW/kg (Calculated) : 73 Heparin Dosing Weight: 93.8kg  Vital Signs: BP: 123/69 (10/20 0630) Pulse Rate: 92 (10/20 0630)  Labs: Recent Labs    08/03/21 1219 08/03/21 1455 08/03/21 1530 08/03/21 2215 08/04/21 0437  HGB 13.3  --   --   --  12.2*  HCT 37.0*  --   --   --  34.6*  PLT 201  --   --   --  189  APTT  --   --  34 71* 63*  HEPARINUNFRC  --   --   --   --  >1.10*  CREATININE 0.76  --   --   --   --   TROPONINIHS 3 4  --   --   --      Estimated Creatinine Clearance: 127.9 mL/min (by C-G formula based on SCr of 0.76 mg/dL).   Medical History: Past Medical History:  Diagnosis Date   Anxiety    Diabetes mellitus without complication (HCC)    HTN (hypertension)    Sickle cell anemia (HCC)     Medications:  Scheduled:   apixaban  10 mg Oral BID   Followed by   08/06/21 ON 08/11/2021] apixaban  5 mg Oral BID   insulin aspart  0-5 Units Subcutaneous QHS   insulin aspart  0-9 Units Subcutaneous TID WC   lisinopril  5 mg Oral Daily   sertraline  100 mg Oral Daily    Assessment: 50yo M with PMH of DM, anxiety, and sickle cell anemia who was admitted to the ED for abnormal labs. Pt D-dimer was 1.36 and CT scan showed PE. Pt was given one dose of apixaban, given possibility of procedure pt was switched to heparin. With no procedure planned, will transition back to apixaban.  Baseline CBC and aPTT were within normal limits Baseline PT/INR was ordered   No PTA anticoagulants noted on chart review   Patient was on heparin infusion, now transitioning to Apixaban dosing for PE.  Goal of Therapy:  Heparin level 0.3-0.7 units/ml aPTT 66-102 seconds Monitor platelets by anticoagulation protocol: Yes     Plan:   --Will stop Heparin infusion and start Apixaban 10mg  BID x 7 days, followed by 5mg  BID thereafter --Daily CBC  50yo, PharmD Clinical Pharmacist 08/04/2021 9:14 AM

## 2021-08-04 NOTE — Consult Note (Signed)
ANTICOAGULATION CONSULT NOTE  Pharmacy Consult for heparin Indication: pulmonary embolus  Allergies  Allergen Reactions   Aspirin    Sulfonamide Derivatives     Patient Measurements: Height: 5\' 10"  (177.8 cm) Weight: 99.8 kg (220 lb) IBW/kg (Calculated) : 73 Heparin Dosing Weight: 93.8kg  Vital Signs: BP: 138/87 (10/20 0430) Pulse Rate: 110 (10/20 0430)  Labs: Recent Labs    08/03/21 1219 08/03/21 1455 08/03/21 1530 08/03/21 2215 08/04/21 0437  HGB 13.3  --   --   --  12.2*  HCT 37.0*  --   --   --  34.6*  PLT 201  --   --   --  189  APTT  --   --  34 71* 63*  HEPARINUNFRC  --   --   --   --  >1.10*  CREATININE 0.76  --   --   --   --   TROPONINIHS 3 4  --   --   --      Estimated Creatinine Clearance: 127.9 mL/min (by C-G formula based on SCr of 0.76 mg/dL).   Medical History: Past Medical History:  Diagnosis Date   Anxiety    Diabetes mellitus without complication (HCC)    HTN (hypertension)    Sickle cell anemia (HCC)     Medications:  Scheduled:   insulin aspart  0-5 Units Subcutaneous QHS   insulin aspart  0-9 Units Subcutaneous TID WC   lisinopril  5 mg Oral Daily   sertraline  100 mg Oral Daily    Assessment: 50yo M with PMH of DM, anxiety, and sickle cell anemia who was admitted to the ED for abnormal labs. Pt D-dimer was 1.36 and CT scan showed PE. Pt was given one dose of apixaban, given possibility of procedure pt was switched to heparin. Pharmacy was consulted for heparin dosing.  Baseline CBC and aPTT were within normal limits Baseline PT/INR was ordered   No PTA anticoagulants noted on chart review   Goal of Therapy:  Heparin level 0.3-0.7 units/ml aPTT 66-102 seconds Monitor platelets by anticoagulation protocol: Yes  10/19 2215 aPTT 71, therapeutic x 1 10/20 0437 aPTT 63, subtherapeutic, HL > 1.10   Plan:  Bolus 1400 units x 1 Increase heparin infusion to 1700 units/hr Recheck aPTT level in 6 hours after rate change Daily  HL while on heparin Continue to monitor H&H and platelets with daily CBC while on heparin   11/20, PharmD, [facility] 08/04/2021 6:10 AM

## 2021-08-04 NOTE — Progress Notes (Signed)
Inpatient Diabetes Program Recommendations  AACE/ADA: New Consensus Statement on Inpatient Glycemic Control (2015)  Target Ranges:  Prepandial:   less than 140 mg/dL      Peak postprandial:   less than 180 mg/dL (1-2 hours)      Critically ill patients:  140 - 180 mg/dL   Lab Results  Component Value Date   GLUCAP 285 (H) 08/03/2021    RevieResults for William Lin, William Lin (MRN [id number]) as of 08/04/2021 11:46  Ref. Range 08/03/2021 18:23 08/03/2021 22:20  Glucose-Capillary Latest Ref Range: 70 - 99 mg/dL 588 (H) 502 (H)  w of Glycemic Control  Diabetes history: DM2 Outpatient Diabetes medications: Metformin 500 mg Daily Current orders for Inpatient glycemic control: Novolog correction 0-9 units tid + hs 0-5 units  Inpatient Diabetes Program Recommendations:   Fasting CBG this am 343. Consider while oral meds held: -Semglee 15 (0.15 kg x 99.8 kg) Noted A1c pending.  Thank you, Billy Fischer. Gayle Martinez, RN, MSN, CDE  Diabetes Coordinator Inpatient Glycemic Control Team Team Pager [phone] (8am-5pm) 08/04/2021 11:47 AM

## 2021-08-04 NOTE — ED Notes (Signed)
Responded to call bell. Pt complaining that pain has increased to 6 out of 10. Pt given 2mg  morphine from PRN orders.

## 2021-08-05 ENCOUNTER — Observation Stay: Payer: 59

## 2021-08-05 DIAGNOSIS — R0602 Shortness of breath: Secondary | ICD-10-CM | POA: Insufficient documentation

## 2021-08-05 DIAGNOSIS — R0603 Acute respiratory distress: Secondary | ICD-10-CM

## 2021-08-05 DIAGNOSIS — Z8249 Family history of ischemic heart disease and other diseases of the circulatory system: Secondary | ICD-10-CM | POA: Diagnosis not present

## 2021-08-05 DIAGNOSIS — F32A Depression, unspecified: Secondary | ICD-10-CM | POA: Diagnosis present

## 2021-08-05 DIAGNOSIS — I2699 Other pulmonary embolism without acute cor pulmonale: Secondary | ICD-10-CM | POA: Diagnosis present

## 2021-08-05 DIAGNOSIS — F419 Anxiety disorder, unspecified: Secondary | ICD-10-CM | POA: Diagnosis present

## 2021-08-05 DIAGNOSIS — R0600 Dyspnea, unspecified: Secondary | ICD-10-CM | POA: Insufficient documentation

## 2021-08-05 DIAGNOSIS — J90 Pleural effusion, not elsewhere classified: Secondary | ICD-10-CM | POA: Diagnosis present

## 2021-08-05 DIAGNOSIS — I2694 Multiple subsegmental pulmonary emboli without acute cor pulmonale: Secondary | ICD-10-CM | POA: Diagnosis present

## 2021-08-05 DIAGNOSIS — Z79899 Other long term (current) drug therapy: Secondary | ICD-10-CM | POA: Diagnosis not present

## 2021-08-05 DIAGNOSIS — R7989 Other specified abnormal findings of blood chemistry: Secondary | ICD-10-CM | POA: Diagnosis present

## 2021-08-05 DIAGNOSIS — R071 Chest pain on breathing: Secondary | ICD-10-CM | POA: Insufficient documentation

## 2021-08-05 DIAGNOSIS — I10 Essential (primary) hypertension: Secondary | ICD-10-CM | POA: Diagnosis present

## 2021-08-05 DIAGNOSIS — I2782 Chronic pulmonary embolism: Secondary | ICD-10-CM | POA: Diagnosis not present

## 2021-08-05 DIAGNOSIS — J180 Bronchopneumonia, unspecified organism: Secondary | ICD-10-CM | POA: Diagnosis present

## 2021-08-05 DIAGNOSIS — Z886 Allergy status to analgesic agent status: Secondary | ICD-10-CM | POA: Diagnosis not present

## 2021-08-05 DIAGNOSIS — F418 Other specified anxiety disorders: Secondary | ICD-10-CM | POA: Diagnosis not present

## 2021-08-05 DIAGNOSIS — E119 Type 2 diabetes mellitus without complications: Secondary | ICD-10-CM | POA: Diagnosis present

## 2021-08-05 DIAGNOSIS — J9811 Atelectasis: Secondary | ICD-10-CM | POA: Diagnosis present

## 2021-08-05 DIAGNOSIS — J9621 Acute and chronic respiratory failure with hypoxia: Secondary | ICD-10-CM | POA: Diagnosis present

## 2021-08-05 DIAGNOSIS — D571 Sickle-cell disease without crisis: Secondary | ICD-10-CM | POA: Diagnosis present

## 2021-08-05 DIAGNOSIS — Z20822 Contact with and (suspected) exposure to covid-19: Secondary | ICD-10-CM | POA: Diagnosis present

## 2021-08-05 DIAGNOSIS — Z882 Allergy status to sulfonamides status: Secondary | ICD-10-CM | POA: Diagnosis not present

## 2021-08-05 DIAGNOSIS — Z8701 Personal history of pneumonia (recurrent): Secondary | ICD-10-CM | POA: Diagnosis not present

## 2021-08-05 DIAGNOSIS — E876 Hypokalemia: Secondary | ICD-10-CM | POA: Diagnosis present

## 2021-08-05 DIAGNOSIS — Z7984 Long term (current) use of oral hypoglycemic drugs: Secondary | ICD-10-CM | POA: Diagnosis not present

## 2021-08-05 LAB — PATHOLOGIST SMEAR REVIEW

## 2021-08-05 LAB — CBC
HCT: 31.1 % — ABNORMAL LOW (ref 39.0–52.0)
Hemoglobin: 11.2 g/dL — ABNORMAL LOW (ref 13.0–17.0)
MCH: 25.7 pg — ABNORMAL LOW (ref 26.0–34.0)
MCHC: 36 g/dL (ref 30.0–36.0)
MCV: 71.3 fL — ABNORMAL LOW (ref 80.0–100.0)
Platelets: 175 10*3/uL (ref 150–400)
RBC: 4.36 MIL/uL (ref 4.22–5.81)
RDW: 15.8 % — ABNORMAL HIGH (ref 11.5–15.5)
WBC: 22.2 10*3/uL — ABNORMAL HIGH (ref 4.0–10.5)
nRBC: 0 % (ref 0.0–0.2)

## 2021-08-05 LAB — LACTATE DEHYDROGENASE: LDH: 117 U/L (ref 98–192)

## 2021-08-05 LAB — CBG MONITORING, ED
Glucose-Capillary: 305 mg/dL — ABNORMAL HIGH (ref 70–99)
Glucose-Capillary: 374 mg/dL — ABNORMAL HIGH (ref 70–99)
Glucose-Capillary: 367 mg/dL — ABNORMAL HIGH (ref 70–99)
Glucose-Capillary: 391 mg/dL — ABNORMAL HIGH (ref 70–99)

## 2021-08-05 LAB — BASIC METABOLIC PANEL
Anion gap: 11 (ref 5–15)
BUN: 11 mg/dL (ref 6–20)
CO2: 23 mmol/L (ref 22–32)
Calcium: 9.3 mg/dL (ref 8.9–10.3)
Chloride: 100 mmol/L (ref 98–111)
Creatinine, Ser: 0.88 mg/dL (ref 0.61–1.24)
GFR, Estimated: >60 mL/min (ref >60)
Glucose, Bld: 280 mg/dL — ABNORMAL HIGH (ref 70–99)
Potassium: 3.8 mmol/L (ref 3.5–5.1)
Sodium: 134 mmol/L — ABNORMAL LOW (ref 135–145)

## 2021-08-05 LAB — MAGNESIUM: Magnesium: 1.6 mg/dL — ABNORMAL LOW (ref 1.7–2.4)

## 2021-08-05 LAB — TROPONIN I (HIGH SENSITIVITY)
Troponin I (High Sensitivity): 8 ng/L (ref <18)
Troponin I (High Sensitivity): 14 ng/L (ref <18)

## 2021-08-05 LAB — PHOSPHORUS: Phosphorus: 3.7 mg/dL (ref 2.5–4.6)

## 2021-08-05 LAB — GLUCOSE, CAPILLARY: Glucose-Capillary: 301 mg/dL — ABNORMAL HIGH (ref 70–99)

## 2021-08-05 LAB — SAVE SMEAR(SSMR), FOR PROVIDER SLIDE REVIEW

## 2021-08-05 LAB — PROCALCITONIN: Procalcitonin: 1.4 ng/mL

## 2021-08-05 MED ORDER — HYDROMORPHONE HCL 1 MG/ML IJ SOLN
1.0000 mg | INTRAMUSCULAR | Status: DC | PRN
Start: 2021-08-05 — End: 2021-08-12
  Administered 2021-08-05 – 2021-08-10 (×6): 1 mg via INTRAVENOUS
  Filled 2021-08-05 (×5): qty 1

## 2021-08-05 MED ORDER — NITROGLYCERIN 0.4 MG SL SUBL
0.4000 mg | SUBLINGUAL_TABLET | SUBLINGUAL | Status: DC | PRN
Start: 2021-08-05 — End: 2021-08-12
  Administered 2021-08-05: 0.4 mg via SUBLINGUAL
  Filled 2021-08-05: qty 1

## 2021-08-05 MED ORDER — KETOROLAC TROMETHAMINE 30 MG/ML IJ SOLN
30.0000 mg | Freq: Four times a day (QID) | INTRAMUSCULAR | Status: DC | PRN
  Administered 2021-08-05: 30 mg via INTRAVENOUS
  Filled 2021-08-05: qty 1

## 2021-08-05 MED ORDER — SODIUM CHLORIDE 0.9 % IV SOLN
500.0000 mg | INTRAVENOUS | Status: AC
  Administered 2021-08-05 – 2021-08-07 (×3): 500 mg via INTRAVENOUS
  Filled 2021-08-05 (×3): qty 500

## 2021-08-05 MED ORDER — MAGNESIUM SULFATE 2 GM/50ML IV SOLN
2.0000 g | Freq: Once | INTRAVENOUS | Status: AC
  Administered 2021-08-05: 2 g via INTRAVENOUS
  Filled 2021-08-05: qty 50

## 2021-08-05 MED ORDER — SODIUM CHLORIDE 0.9 % IV SOLN
2.0000 g | INTRAVENOUS | Status: AC
  Administered 2021-08-05 – 2021-08-09 (×5): 2 g via INTRAVENOUS
  Filled 2021-08-05 (×3): qty 20
  Filled 2021-08-05 (×2): qty 2

## 2021-08-05 MED ORDER — INSULIN GLARGINE-YFGN 100 UNIT/ML ~~LOC~~ SOLN
15.0000 [IU] | Freq: Two times a day (BID) | SUBCUTANEOUS | Status: DC
  Administered 2021-08-05 – 2021-08-06 (×2): 15 [IU] via SUBCUTANEOUS
  Filled 2021-08-05 (×3): qty 0.15

## 2021-08-05 MED ORDER — INSULIN ASPART 100 UNIT/ML IJ SOLN
0.0000 [IU] | Freq: Three times a day (TID) | INTRAMUSCULAR | Status: DC
  Administered 2021-08-05: 15 [IU] via SUBCUTANEOUS
  Administered 2021-08-06 (×2): 5 [IU] via SUBCUTANEOUS
  Administered 2021-08-07 (×2): 8 [IU] via SUBCUTANEOUS
  Administered 2021-08-07: 5 [IU] via SUBCUTANEOUS
  Administered 2021-08-08 (×2): 8 [IU] via SUBCUTANEOUS
  Administered 2021-08-08: 5 [IU] via SUBCUTANEOUS
  Administered 2021-08-09: 2 [IU] via SUBCUTANEOUS
  Administered 2021-08-09: 3 [IU] via SUBCUTANEOUS
  Administered 2021-08-10: 5 [IU] via SUBCUTANEOUS
  Administered 2021-08-10 – 2021-08-11 (×3): 3 [IU] via SUBCUTANEOUS
  Filled 2021-08-05 (×16): qty 1

## 2021-08-05 NOTE — ED Notes (Signed)
Report received from Chelsea,RN

## 2021-08-05 NOTE — ED Notes (Signed)
Patient received food tray. Still awaiting transport to take pt to his room. Updated pt and spouse with verbal understanding

## 2021-08-05 NOTE — Progress Notes (Addendum)
Inpatient Diabetes Program Recommendations  AACE/ADA: New Consensus Statement on Inpatient Glycemic Control (2015)  Target Ranges:  Prepandial:   less than 140 mg/dL      Peak postprandial:   less than 180 mg/dL (1-2 hours)      Critically ill patients:  140 - 180 mg/dL   Lab Results  Component Value Date   GLUCAP 344 (H) 08/04/2021   HGBA1C 6.8 (H) 08/03/2021    Results for CAYDN, JUSTEN (MRN [id number]) as of 08/05/2021 10:05  Ref. Range 08/03/2021 18:23 08/03/2021 22:20 08/04/2021 08:35 08/04/2021 16:58  Glucose-Capillary Latest Ref Range: 70 - 99 mg/dL 983 (H) 382 (H) 505 (H) 344 (H)  Results for KADRIAN, PARTCH (MRN [id number]) as of 08/05/2021 10:05  Ref. Range 08/05/2021 06:13  Glucose Latest Ref Range: 70 - 99 mg/dL 379 (H)   Diabetes history: DM2 Outpatient Diabetes medications: Metformin 500 mg Daily Current orders for Inpatient glycemic control:  Novolog correction 0-9 units tid + hs  Semglee 15 units Daily  NOTE: A1c possibly not accurate due to sickle cell, however, pt still makes fetal Hgb and does not have sickle cell crisis.  Inpatient Diabetes Program Recommendations:    -  Increase Semglee to 25 units  May need insulin at home. Will ask pt what his trends are at home...  Spoke with pt and wife at bedside. Pt reports checking his glucose when he knows its high. Once in awhile he cheats such as when he recently went to [place]... He also had a business trip recently and cheated with his diet at that time. Pt reports mostly being in the 180-200 range when he is watching what he eats.   Discussed glucose and A1c goals. Asked pt his PCP may need to perform Fructosamine levels for glucose control instead of an A1c for accuracy. Encouraged pt to check glucose everyday twice a day to see what his trends are doing.  Will watch glucose trends for now. Current trends in the 300 range maybe due to stress response.  Thanks,  Christena Deem RN, MSN, BC-ADM Inpatient  Diabetes Coordinator Team Pager [phone] (8a-5p)

## 2021-08-05 NOTE — Progress Notes (Signed)
PROGRESS NOTE    William Lin  VOZ:[id number] DOB: January 21, 1971 DOA: 08/03/2021 PCP: [facility] [place]   Brief Narrative: Taken from prior notes. This 50 years old male with PMH significant for hypertension, diabetes, depression, anxiety,  questionable history of sickle cell anemia ( mild variant no anemia never had any crisis ) per patient presented in the ED with chest pain. Patient reports long distance travel recently.  He flew to [place] a week ago and then [place] a month ago.  Patient also reported discomfort in the left calf area which has resolved a week ago.  Patient was found to have elevated D-dimer and was sent in the ED.  CTA chest confirms pulmonary embolism on left basal subsegmental level, no right heart strain.  Patient initially started on heparin infusion as there was no hypoxia he was transitioned to Eliquis.  Bilateral lower extremity Doppler was negative for DVT did show a small superficial thrombophlebitis in left short saphenous vein.  10/21: Patient developed worsening left-sided chest pain, associated with diaphoresis, tachycardia and tachypnea.  EKG without any significant ST changes and troponin remained negative. Morphine followed by Toradol help with the pain. Also consulted PCCM and they showed a concern of acute chest with his questionable history of sickle cell. Contacted sickle cell team at [facility], they are not very impressed with acute chest or even sickle cell crisis with no prior history.  Recommended to testing for sickle cell if he has a real diagnosis-labs ordered.  Subjective: Patient was having left-sided chest pain and appears quite anxious when seen during morning rounds.  He was tachycardic and tachypneic and becoming hypoxic requiring 2 to 3 L of oxygen.  He was unable to take a deep breath as it increases his chest pain.  Assessment & Plan:   Principal Problem:   Acute pulmonary embolism (HCC) Active Problems:    Diabetes mellitus without complication (HCC)   Depression with anxiety   HTN (hypertension)   Pulmonary embolism (HCC)  Chest pain/questionable sickle cell trait.  EKG without any significant ST changes and troponin remain negative.  Pain improved with morphine followed by Toradol.  Has a pleuritic component and it.  Chest x-ray with some left-sided density and a concern of left-sided pleural effusion. Patient had left basal PE.  Procalcitonin at 1.40.  Leukocytosis. Also contacted PCCM-they were advising to contact sickle cell team due to concern of acute chest.  Patient never had any sickle cell crisis and also not sure about his diagnosis. Contacted sickle cell team at [facility] are not very impressed with his diagnosis at this time and advising just antibiotics and pain control at this time.  Also advised to do some sickle cell labs which were ordered and if came back positive then they would like to see him in their sickle cell clinic.  LDH within normal limit. Might be pleuritic irritation with pneumonia or pulmonary infarct. -Start him on ceftriaxone and Zithromax. -Continue with pain management -Smear review -Sickle cell screening  -Hemoglobin fractionation.  Pulmonary embolism.  Most likely provoked as he has a recent history of long flights. -Continue with Eliquis -Continue with supplemental oxygen-wean as tolerated.  Type 2 diabetes mellitus.  CBG elevated.  Uses Actos and metformin at home. -Continue with SSI -Increase Semglee to 15 units twice daily.  Depression anxiety: Continue Zoloft   Hypertension: Continue home lisinopril  Objective: Vitals:   08/05/21 0957 08/05/21 1100 08/05/21 1149 08/05/21 1530  BP:   118/65 135/89  Pulse: (!) 104 100 (!) 104 (!) 110  Resp: (!) 31 17 (!) 21 19  Temp:   98.3 F (36.8 C)   TempSrc:   Oral   SpO2: 96% 95% 94% 96%  Weight:      Height:        Intake/Output Summary (Last 24 hours) at 08/05/2021 1600 Last data filed  at 08/05/2021 1528 Gross per 24 hour  Intake 150 ml  Output 600 ml  Net -450 ml   Filed Weights   08/03/21 1214  Weight: 99.8 kg    Examination:  General exam: Well-developed gentleman who appears anxious and in pain. Respiratory system: Clear to auscultation.  Mildly tachypneic Cardiovascular system: Sinus tachycardia Gastrointestinal system: Soft, nontender, nondistended, bowel sounds positive. Central nervous system: Alert and oriented. No focal neurological deficits. Extremities: No edema, no cyanosis, pulses intact and symmetrical. Psychiatry: Judgement and insight appear normal.   DVT prophylaxis: Eliquis Code Status: Full Family Communication: Discussed with wife at bedside. Disposition Plan:  Status is: Inpatient  Remains inpatient appropriate because: Of the severity of illness  Level of care: Progressive Cardiac  All the records are reviewed and case discussed with Care Management/Social Worker. Management plans discussed with the patient, nursing and they are in agreement.  Consultants:  PCCM  Procedures:  Antimicrobials:  Ceftriaxone Zithromax  Data Reviewed: I have personally reviewed following labs and imaging studies  CBC: Recent Labs  Lab 08/03/21 1219 08/04/21 0437 08/05/21 0613  WBC 10.2 16.6* 22.2*  HGB 13.3 12.2* 11.2*  HCT 37.0* 34.6* 31.1*  MCV 72.5* 72.8* 71.3*  PLT 201 189 175   Basic Metabolic Panel: Recent Labs  Lab 08/03/21 1219 08/05/21 0613  NA 134* 134*  K 3.8 3.8  CL 98 100  CO2 26 23  GLUCOSE 311* 280*  BUN 6 11  CREATININE 0.76 0.88  CALCIUM 9.6 9.3  MG  --  1.6*  PHOS  --  3.7   GFR: Estimated Creatinine Clearance: 116.3 mL/min (by C-G formula based on SCr of 0.88 mg/dL). Liver Function Tests: No results for input(s): AST, ALT, ALKPHOS, BILITOT, PROT, ALBUMIN in the last 168 hours. No results for input(s): LIPASE, AMYLASE in the last 168 hours. No results for input(s): AMMONIA in the last 168  hours. Coagulation Profile: No results for input(s): INR, PROTIME in the last 168 hours. Cardiac Enzymes: No results for input(s): CKTOTAL, CKMB, CKMBINDEX, TROPONINI in the last 168 hours. BNP (last 3 results) No results for input(s): PROBNP in the last 8760 hours. HbA1C: Recent Labs    08/03/21 1219  HGBA1C 6.8*   CBG: Recent Labs  Lab 08/04/21 0835 08/04/21 1658 08/04/21 2015 08/05/21 0750 08/05/21 1144  GLUCAP 343* 344* 305* 374* 367*   Lipid Profile: No results for input(s): CHOL, HDL, LDLCALC, TRIG, CHOLHDL, LDLDIRECT in the last 72 hours. Thyroid Function Tests: No results for input(s): TSH, T4TOTAL, FREET4, T3FREE, THYROIDAB in the last 72 hours. Anemia Panel: No results for input(s): VITAMINB12, FOLATE, FERRITIN, TIBC, IRON, RETICCTPCT in the last 72 hours. Sepsis Labs: Recent Labs  Lab 08/05/21 1440  PROCALCITON 1.40    Recent Results (from the past 240 hour(s))  Resp Panel by RT-PCR (Flu A&B, Covid) Nasopharyngeal Swab     Status: None   Collection Time: 08/03/21  3:25 PM   Specimen: Nasopharyngeal Swab; Nasopharyngeal(NP) swabs in vial transport medium  Result Value Ref Range Status   SARS Coronavirus 2 by RT PCR NEGATIVE NEGATIVE Final    Comment: (NOTE) SARS-CoV-2  target nucleic acids are NOT DETECTED.  The SARS-CoV-2 RNA is generally detectable in upper respiratory specimens during the acute phase of infection. The lowest concentration of SARS-CoV-2 viral copies this assay can detect is 138 copies/mL. A negative result does not preclude SARS-Cov-2 infection and should not be used as the sole basis for treatment or other patient management decisions. A negative result may occur with  improper specimen collection/handling, submission of specimen other than nasopharyngeal swab, presence of viral mutation(s) within the areas targeted by this assay, and inadequate number of viral copies(<138 copies/mL). A negative result must be combined with clinical  observations, patient history, and epidemiological information. The expected result is Negative.  Fact Sheet for Patients:  BloggerCourse.com  Fact Sheet for Healthcare Providers:  SeriousBroker.it  This test is no t yet approved or cleared by the [place] FDA and  has been authorized for detection and/or diagnosis of SARS-CoV-2 by FDA under an Emergency Use Authorization (EUA). This EUA will remain  in effect (meaning this test can be used) for the duration of the COVID-19 declaration under Section 564(b)(1) of the Act, 21 U.S.C.section 360bbb-3(b)(1), unless the authorization is terminated  or revoked sooner.       Influenza A by PCR NEGATIVE NEGATIVE Final   Influenza B by PCR NEGATIVE NEGATIVE Final    Comment: (NOTE) The Xpert Xpress SARS-CoV-2/FLU/RSV plus assay is intended as an aid in the diagnosis of influenza from Nasopharyngeal swab specimens and should not be used as a sole basis for treatment. Nasal washings and aspirates are unacceptable for Xpert Xpress SARS-CoV-2/FLU/RSV testing.  Fact Sheet for Patients: BloggerCourse.com  Fact Sheet for Healthcare Providers: SeriousBroker.it  This test is not yet approved or cleared by the [place] FDA and has been authorized for detection and/or diagnosis of SARS-CoV-2 by FDA under an Emergency Use Authorization (EUA). This EUA will remain in effect (meaning this test can be used) for the duration of the COVID-19 declaration under Section 564(b)(1) of the Act, 21 U.S.C. section 360bbb-3(b)(1), unless the authorization is terminated or revoked.  Performed at [facility], [address], [place], [place] [address]      Radiology Studies: US Venous Img Lower Bilateral (DVT)  Result Date: 08/03/2021 CLINICAL DATA:  Pulmonary embolism. EXAM: BILATERAL LOWER EXTREMITY VENOUS DOPPLER ULTRASOUND  TECHNIQUE: Gray-scale sonography with graded compression, as well as color Doppler and duplex ultrasound were performed to evaluate the lower extremity deep venous systems from the level of the common femoral vein and including the common femoral, femoral, profunda femoral, popliteal and calf veins including the posterior tibial, peroneal and gastrocnemius veins when visible. The superficial great saphenous vein was also interrogated. Spectral Doppler was utilized to evaluate flow at rest and with distal augmentation maneuvers in the common femoral, femoral and popliteal veins. COMPARISON:  None. FINDINGS: RIGHT LOWER EXTREMITY Common Femoral Vein: No evidence of thrombus. Normal compressibility, respiratory phasicity and response to augmentation. Saphenofemoral Junction: No evidence of thrombus. Normal compressibility and flow on color Doppler imaging. Profunda Femoral Vein: No evidence of thrombus. Normal compressibility and flow on color Doppler imaging. Femoral Vein: No evidence of thrombus. Normal compressibility, respiratory phasicity and response to augmentation. Popliteal Vein: No evidence of thrombus. Normal compressibility, respiratory phasicity and response to augmentation. Calf Veins: No evidence of thrombus. Normal compressibility and flow on color Doppler imaging. Superficial Great Saphenous Vein: No evidence of thrombus. Normal compressibility. Venous Reflux:  None. Other Findings: No evidence of superficial thrombophlebitis or abnormal fluid collection. LEFT LOWER  EXTREMITY Common Femoral Vein: No evidence of thrombus. Normal compressibility, respiratory phasicity and response to augmentation. Saphenofemoral Junction: No evidence of thrombus. Normal compressibility and flow on color Doppler imaging. Profunda Femoral Vein: No evidence of thrombus. Normal compressibility and flow on color Doppler imaging. Femoral Vein: No evidence of thrombus. Normal compressibility, respiratory phasicity and response  to augmentation. Popliteal Vein: No evidence of thrombus. Normal compressibility, respiratory phasicity and response to augmentation. Calf Veins: No evidence of thrombus. Normal compressibility and flow on color Doppler imaging. Superficial Great Saphenous Vein: No evidence of thrombus. Normal compressibility. Venous Reflux:  None. Other Findings: There is thrombus noted in the left short saphenous vein behind the knee and extending into the calf. This thrombus does not visibly extend into the left popliteal vein. IMPRESSION: No evidence of deep venous thrombosis in either lower extremity. Superficial thrombophlebitis noted in the left short saphenous vein. Electronically Signed   By: Irish Lack M.D.   On: 08/03/2021 16:59   DG Chest Port 1 View  Result Date: 08/05/2021 CLINICAL DATA:  50 year old male shortness of breath. Chest pain and diaphoresis. Positive left pulmonary embolus on recent CTA. EXAM: PORTABLE CHEST 1 VIEW COMPARISON:  CTA chest 08/03/2021 and earlier. FINDINGS: Portable AP upright view at 0905 hours. Lower lung volumes with increasing dense opacity at the left lung base most resembling pleural effusion. No pulmonary edema or pneumothorax. Visible mediastinal contours remain stable. Patchy increased right lung opacity more resembling atelectasis. No acute osseous abnormality identified. Negative visible bowel gas. IMPRESSION: Lower lung volumes with increasing dense opacification at the left lung base. Favor acute pleural effusion, although pulmonary infarct not excluded in this setting. Increased right lung base atelectasis. Electronically Signed   By: Odessa Fleming M.D.   On: 08/05/2021 09:37    Scheduled Meds:  apixaban  10 mg Oral BID   Followed by   Melene Muller ON 08/11/2021] apixaban  5 mg Oral BID   insulin aspart  0-15 Units Subcutaneous TID WC   insulin aspart  0-5 Units Subcutaneous QHS   insulin glargine-yfgn  15 Units Subcutaneous BID   lisinopril  5 mg Oral Daily   sertraline   100 mg Oral Daily   Continuous Infusions:  sodium chloride 100 mL/hr at 08/05/21 0450   azithromycin 500 mg (08/05/21 1534)   cefTRIAXone (ROCEPHIN)  IV Stopped (08/05/21 1528)     LOS: 0 days   Time spent: 50 minutes. More than 50% of the time was spent in counseling/coordination of care  Arnetha Courser, MD [facility]  If 7PM-7AM, please contact night-coverage [email or web address]  08/05/2021, 4:00 PM   This record has been created using Conservation officer, historic buildings. Errors have been sought and corrected,but may not always be located. Such creation errors do not reflect on the standard of care.

## 2021-08-05 NOTE — Progress Notes (Signed)
Patient dyspneic at rest with labored breathing, chest pain reported 9/10. Patient diaphoretic and tachy on the monitor. Morphine given with relief to both chest pain and respiratory efforts.

## 2021-08-05 NOTE — ED Notes (Signed)
Pt was given cranberry juice

## 2021-08-05 NOTE — ED Notes (Signed)
Pt reports feels weak to lt side to tech, RN at bedside to assess. Pt reqports weak feeling is not new,  pt moves extremeties, equal strengty bilat upper extremeties. Denies tingling, numbness , decreased sensation at this time

## 2021-08-06 ENCOUNTER — Inpatient Hospital Stay: Payer: 59

## 2021-08-06 DIAGNOSIS — R071 Chest pain on breathing: Secondary | ICD-10-CM | POA: Diagnosis not present

## 2021-08-06 DIAGNOSIS — F418 Other specified anxiety disorders: Secondary | ICD-10-CM | POA: Diagnosis not present

## 2021-08-06 DIAGNOSIS — I2699 Other pulmonary embolism without acute cor pulmonale: Secondary | ICD-10-CM | POA: Diagnosis not present

## 2021-08-06 DIAGNOSIS — E119 Type 2 diabetes mellitus without complications: Secondary | ICD-10-CM | POA: Diagnosis not present

## 2021-08-06 LAB — RETICULOCYTES
Immature Retic Fract: 28.5 % — ABNORMAL HIGH (ref 2.3–15.9)
RBC.: 3.97 MIL/uL — ABNORMAL LOW (ref 4.22–5.81)
Retic Count, Absolute: 57.6 10*3/uL (ref 19.0–186.0)
Retic Ct Pct: 1.5 % (ref 0.4–3.1)

## 2021-08-06 LAB — CBC WITH DIFFERENTIAL/PLATELET
Abs Immature Granulocytes: 0.16 10*3/uL — ABNORMAL HIGH (ref 0.00–0.07)
Basophils Absolute: 0 10*3/uL (ref 0.0–0.1)
Basophils Relative: 0 %
Eosinophils Absolute: 0.2 10*3/uL (ref 0.0–0.5)
Eosinophils Relative: 1 %
HCT: 27.9 % — ABNORMAL LOW (ref 39.0–52.0)
Hemoglobin: 10.1 g/dL — ABNORMAL LOW (ref 13.0–17.0)
Immature Granulocytes: 1 %
Lymphocytes Relative: 8 %
Lymphs Abs: 1.2 10*3/uL (ref 0.7–4.0)
MCH: 25.8 pg — ABNORMAL LOW (ref 26.0–34.0)
MCHC: 36.2 g/dL — ABNORMAL HIGH (ref 30.0–36.0)
MCV: 71.2 fL — ABNORMAL LOW (ref 80.0–100.0)
Monocytes Absolute: 1.5 10*3/uL — ABNORMAL HIGH (ref 0.1–1.0)
Monocytes Relative: 10 %
Neutro Abs: 12 10*3/uL — ABNORMAL HIGH (ref 1.7–7.7)
Neutrophils Relative %: 80 %
Platelets: 161 10*3/uL (ref 150–400)
RBC: 3.92 MIL/uL — ABNORMAL LOW (ref 4.22–5.81)
RDW: 15.9 % — ABNORMAL HIGH (ref 11.5–15.5)
WBC: 15.1 10*3/uL — ABNORMAL HIGH (ref 4.0–10.5)
nRBC: 0 % (ref 0.0–0.2)

## 2021-08-06 LAB — IRON AND TIBC
Iron: 20 ug/dL — ABNORMAL LOW (ref 45–182)
Saturation Ratios: 9 % — ABNORMAL LOW (ref 17.9–39.5)
TIBC: 230 ug/dL — ABNORMAL LOW (ref 250–450)
UIBC: 210 ug/dL

## 2021-08-06 LAB — GLUCOSE, CAPILLARY
Glucose-Capillary: 201 mg/dL — ABNORMAL HIGH (ref 70–99)
Glucose-Capillary: 270 mg/dL — ABNORMAL HIGH (ref 70–99)
Glucose-Capillary: 416 mg/dL — ABNORMAL HIGH (ref 70–99)
Glucose-Capillary: 369 mg/dL — ABNORMAL HIGH (ref 70–99)
Glucose-Capillary: 214 mg/dL — ABNORMAL HIGH (ref 70–99)
Glucose-Capillary: 176 mg/dL — ABNORMAL HIGH (ref 70–99)

## 2021-08-06 LAB — FOLATE: Folate: 5.7 ng/mL — ABNORMAL LOW (ref >5.9)

## 2021-08-06 LAB — FERRITIN: Ferritin: 920 ng/mL — ABNORMAL HIGH (ref 24–336)

## 2021-08-06 LAB — VITAMIN B12: Vitamin B-12: 290 pg/mL (ref 180–914)

## 2021-08-06 MED ORDER — INSULIN ASPART 100 UNIT/ML IJ SOLN
20.0000 [IU] | Freq: Once | INTRAMUSCULAR | Status: AC
  Administered 2021-08-06: 20 [IU] via SUBCUTANEOUS

## 2021-08-06 MED ORDER — INSULIN ASPART 100 UNIT/ML IJ SOLN
6.0000 [IU] | Freq: Three times a day (TID) | INTRAMUSCULAR | Status: DC
  Administered 2021-08-06 – 2021-08-07 (×4): 6 [IU] via SUBCUTANEOUS
  Filled 2021-08-06 (×4): qty 1

## 2021-08-06 MED ORDER — IPRATROPIUM-ALBUTEROL 0.5-2.5 (3) MG/3ML IN SOLN
3.0000 mL | RESPIRATORY_TRACT | Status: DC
  Administered 2021-08-06 – 2021-08-09 (×17): 3 mL via RESPIRATORY_TRACT
  Filled 2021-08-06 (×17): qty 3

## 2021-08-06 MED ORDER — INSULIN GLARGINE-YFGN 100 UNIT/ML ~~LOC~~ SOLN
20.0000 [IU] | Freq: Two times a day (BID) | SUBCUTANEOUS | Status: DC
  Administered 2021-08-06 – 2021-08-07 (×2): 20 [IU] via SUBCUTANEOUS
  Filled 2021-08-06 (×3): qty 0.2

## 2021-08-06 MED ORDER — INSULIN ASPART 100 UNIT/ML IJ SOLN
15.0000 [IU] | Freq: Once | INTRAMUSCULAR | Status: AC
  Administered 2021-08-06: 15 [IU] via SUBCUTANEOUS
  Filled 2021-08-06: qty 1

## 2021-08-06 NOTE — Progress Notes (Signed)
SATURATION QUALIFICATIONS: (This note is used to comply with regulatory documentation for home oxygen)  Patient Saturations on Room Air at Rest = 87%  Patient Saturations on 3 Liters of oxygen at rest = 94%  Please briefly explain why patient needs home oxygen: Patient's O2 drops when not on oxygen at rest.

## 2021-08-06 NOTE — Progress Notes (Addendum)
PROGRESS NOTE    William Lin  YOV:[id number] DOB: January 15, 1971 DOA: 08/03/2021 PCP: [facility] @ [place]   Brief Narrative: Taken from prior notes. This 50 years old male with PMH significant for hypertension, diabetes, depression, anxiety,  questionable history of sickle cell anemia ( mild variant no anemia never had any crisis ) per patient presented in the ED with chest pain. Patient reports long distance travel recently.  He flew to [place] a week ago and then [place] a month ago.  Patient also reported discomfort in the left calf area which has resolved a week ago.  Patient was found to have elevated D-dimer and was sent in the ED.  CTA chest confirms pulmonary embolism on left basal subsegmental level, no right heart strain.  Patient initially started on heparin infusion as there was no hypoxia he was transitioned to Eliquis.  Bilateral lower extremity Doppler was negative for DVT did show a small superficial thrombophlebitis in left short saphenous vein.  10/21: Patient developed worsening left-sided chest pain, associated with diaphoresis, tachycardia and tachypnea.  EKG without any significant ST changes and troponin remained negative. Morphine followed by Toradol help with the pain. Also consulted PCCM and they showed a concern of acute chest with his questionable history of sickle cell. Contacted sickle cell team at [facility] long, they are not very impressed with acute chest or even sickle cell crisis with no prior history.  Recommended to testing for sickle cell if he has a real diagnosis-labs ordered.  10/22: Smear with target cells and microcytosis more consistent with thalassemia trait.  Talked with patient and according to him he is being followed up at [facility] sickle cell team and had that diagnosis since childhood.  Again denied any crisis.  Subjective: Patient continued to have some shortness of breath and left-sided chest pain with deep breathing.  Patient has  an history of left base pneumonia multiple times before.  Assessment & Plan:   Principal Problem:   Acute pulmonary embolism (HCC) Active Problems:   Diabetes mellitus without complication (HCC)   Depression with anxiety   HTN (hypertension)   Pulmonary embolism (HCC)  Chest pain/questionable sickle cell trait.  EKG without any significant ST changes and troponin remain negative.  Pain improved with morphine followed by Toradol.  Has a pleuritic component and it.  Chest x-ray with some left-sided density and a concern of left-sided pleural effusion. Patient had left basal PE.  Procalcitonin at 1.40.  Leukocytosis. Also contacted PCCM-they were advising to contact sickle cell team due to concern of acute chest.  Patient never had any sickle cell crisis and also not sure about his diagnosis. Contacted sickle cell team at [facility] are not very impressed with his diagnosis at this time and advising just antibiotics and pain control at this time.  Also advised to do some sickle cell labs which were ordered and if came back positive then they would like to see him in their sickle cell clinic.  LDH within normal limit.  Smear with few target cells and microcytosis more consistent with thalassemia trait.  Rest of the labs still pending. Might be pleuritic irritation with pneumonia or pulmonary infarct. -Repeat chest x-ray, if worsening left-sided effusion we will try thoracentesis. -Continue ceftriaxone and Zithromax. -Continue with pain management -Follow-up hemoglobin fractionation and sickle cell screening labs-still pending.  Acute anemia.  Hemoglobin continued to drop despite no obvious bleeding.  Smear with concern of thalassemia trait at this time.  Dr. Melvyn Neth from PCCM  did bedside ultrasound yesterday and was not concerned about any bleeding.  Chest x-ray yesterday concerning for some pleural effusion.  Patient is on Eliquis. -We will repeat chest x-ray -Anemia panel -Monitor  hemoglobin  Pulmonary embolism.  Most likely provoked as he has a recent history of long flights. -Continue with Eliquis -Continue with supplemental oxygen-wean as tolerated.  Type 2 diabetes mellitus.  CBG elevated.  Uses Actos and metformin at home. -Continue with SSI -Increase Semglee to 15 units twice daily.  Depression anxiety: Continue Zoloft   Hypertension: Continue home lisinopril  Objective: Vitals:   08/06/21 0819 08/06/21 1120 08/06/21 1127 08/06/21 1129  BP:   114/78   Pulse:   (!) 110   Resp:   19   Temp:   98.4 F (36.9 C)   TempSrc:   Axillary   SpO2: 96% 93% 100% 94%  Weight:      Height:        Intake/Output Summary (Last 24 hours) at 08/06/2021 1431 Last data filed at 08/06/2021 1345 Gross per 24 hour  Intake 5868.71 ml  Output 2800 ml  Net 3068.71 ml    Filed Weights   08/03/21 1214  Weight: 99.8 kg    Examination:  General.  Well-developed gentleman, in no acute distress. Pulmonary.  Lungs clear bilaterally, decreased breath sound at left base, normal respiratory effort. CV.  Regular rate and rhythm, no JVD, rub or murmur. Abdomen.  Soft, nontender, nondistended, BS positive. CNS.  Alert and oriented .  No focal neurologic deficit. Extremities.  No edema, no cyanosis, pulses intact and symmetrical. Psychiatry.  Judgment and insight appears normal.   DVT prophylaxis: Eliquis Code Status: Full Family Communication: Discussed with wife at bedside. Disposition Plan:  Status is: Inpatient  Remains inpatient appropriate because: Of the severity of illness  Level of care: Progressive Cardiac  All the records are reviewed and case discussed with Care Management/Social Worker. Management plans discussed with the patient, nursing and they are in agreement.  Consultants:  PCCM  Procedures:  Antimicrobials:  Ceftriaxone Zithromax  Data Reviewed: I have personally reviewed following labs and imaging studies  CBC: Recent Labs  Lab  08/03/21 1219 08/04/21 0437 08/05/21 0613 08/06/21 0941  WBC 10.2 16.6* 22.2* 15.1*  NEUTROABS  --   --   --  12.0*  HGB 13.3 12.2* 11.2* 10.1*  HCT 37.0* 34.6* 31.1* 27.9*  MCV 72.5* 72.8* 71.3* 71.2*  PLT 201 189 175 161    Basic Metabolic Panel: Recent Labs  Lab 08/03/21 1219 08/05/21 0613  NA 134* 134*  K 3.8 3.8  CL 98 100  CO2 26 23  GLUCOSE 311* 280*  BUN 6 11  CREATININE 0.76 0.88  CALCIUM 9.6 9.3  MG  --  1.6*  PHOS  --  3.7    GFR: Estimated Creatinine Clearance: 116.3 mL/min (by C-G formula based on SCr of 0.88 mg/dL). Liver Function Tests: No results for input(s): AST, ALT, ALKPHOS, BILITOT, PROT, ALBUMIN in the last 168 hours. No results for input(s): LIPASE, AMYLASE in the last 168 hours. No results for input(s): AMMONIA in the last 168 hours. Coagulation Profile: No results for input(s): INR, PROTIME in the last 168 hours. Cardiac Enzymes: No results for input(s): CKTOTAL, CKMB, CKMBINDEX, TROPONINI in the last 168 hours. BNP (last 3 results) No results for input(s): PROBNP in the last 8760 hours. HbA1C: No results for input(s): HGBA1C in the last 72 hours.  CBG: Recent Labs  Lab 08/05/21 2156 08/06/21 8546  08/06/21 0814 08/06/21 1121 08/06/21 1255  GLUCAP 301* 201* 270* 416* 369*    Lipid Profile: No results for input(s): CHOL, HDL, LDLCALC, TRIG, CHOLHDL, LDLDIRECT in the last 72 hours. Thyroid Function Tests: No results for input(s): TSH, T4TOTAL, FREET4, T3FREE, THYROIDAB in the last 72 hours. Anemia Panel: No results for input(s): VITAMINB12, FOLATE, FERRITIN, TIBC, IRON, RETICCTPCT in the last 72 hours. Sepsis Labs: Recent Labs  Lab 08/05/21 1440  PROCALCITON 1.40     Recent Results (from the past 240 hour(s))  Resp Panel by RT-PCR (Flu A&B, Covid) Nasopharyngeal Swab     Status: None   Collection Time: 08/03/21  3:25 PM   Specimen: Nasopharyngeal Swab; Nasopharyngeal(NP) swabs in vial transport medium  Result Value Ref  Range Status   SARS Coronavirus 2 by RT PCR NEGATIVE NEGATIVE Final    Comment: (NOTE) SARS-CoV-2 target nucleic acids are NOT DETECTED.  The SARS-CoV-2 RNA is generally detectable in upper respiratory specimens during the acute phase of infection. The lowest concentration of SARS-CoV-2 viral copies this assay can detect is 138 copies/mL. A negative result does not preclude SARS-Cov-2 infection and should not be used as the sole basis for treatment or other patient management decisions. A negative result may occur with  improper specimen collection/handling, submission of specimen other than nasopharyngeal swab, presence of viral mutation(s) within the areas targeted by this assay, and inadequate number of viral copies(<138 copies/mL). A negative result must be combined with clinical observations, patient history, and epidemiological information. The expected result is Negative.  Fact Sheet for Patients:  BloggerCourse.com  Fact Sheet for Healthcare Providers:  SeriousBroker.it  This test is no t yet approved or cleared by the [place] FDA and  has been authorized for detection and/or diagnosis of SARS-CoV-2 by FDA under an Emergency Use Authorization (EUA). This EUA will remain  in effect (meaning this test can be used) for the duration of the COVID-19 declaration under Section 564(b)(1) of the Act, 21 U.S.C.section 360bbb-3(b)(1), unless the authorization is terminated  or revoked sooner.       Influenza A by PCR NEGATIVE NEGATIVE Final   Influenza B by PCR NEGATIVE NEGATIVE Final    Comment: (NOTE) The Xpert Xpress SARS-CoV-2/FLU/RSV plus assay is intended as an aid in the diagnosis of influenza from Nasopharyngeal swab specimens and should not be used as a sole basis for treatment. Nasal washings and aspirates are unacceptable for Xpert Xpress SARS-CoV-2/FLU/RSV testing.  Fact Sheet for  Patients: BloggerCourse.com  Fact Sheet for Healthcare Providers: SeriousBroker.it  This test is not yet approved or cleared by the [place] FDA and has been authorized for detection and/or diagnosis of SARS-CoV-2 by FDA under an Emergency Use Authorization (EUA). This EUA will remain in effect (meaning this test can be used) for the duration of the COVID-19 declaration under Section 564(b)(1) of the Act, 21 U.S.C. section 360bbb-3(b)(1), unless the authorization is terminated or revoked.  Performed at [facility], [address], [place], [place] [address]       Radiology Studies: [facility] Chest [place] 1 View  Result Date: 08/05/2021 CLINICAL DATA:  50 year old male shortness of breath. Chest pain and diaphoresis. Positive left pulmonary embolus on recent CTA. EXAM: PORTABLE CHEST 1 VIEW COMPARISON:  CTA chest 08/03/2021 and earlier. FINDINGS: Portable AP upright view at 0905 hours. Lower lung volumes with increasing dense opacity at the left lung base most resembling pleural effusion. No pulmonary edema or pneumothorax. Visible mediastinal contours remain stable. Patchy increased right lung  opacity more resembling atelectasis. No acute osseous abnormality identified. Negative visible bowel gas. IMPRESSION: Lower lung volumes with increasing dense opacification at the left lung base. Favor acute pleural effusion, although pulmonary infarct not excluded in this setting. Increased right lung base atelectasis. Electronically Signed   By: Odessa Fleming M.D.   On: 08/05/2021 09:37    Scheduled Meds:  apixaban  10 mg Oral BID   Followed by   Melene Muller ON 08/11/2021] apixaban  5 mg Oral BID   insulin aspart  0-15 Units Subcutaneous TID WC   insulin aspart  0-5 Units Subcutaneous QHS   insulin aspart  6 Units Subcutaneous TID WC   insulin glargine-yfgn  20 Units Subcutaneous BID   ipratropium-albuterol  3 mL Nebulization Q4H   lisinopril  5  mg Oral Daily   sertraline  100 mg Oral Daily   Continuous Infusions:  azithromycin 500 mg (08/06/21 1257)   cefTRIAXone (ROCEPHIN)  IV 2 g (08/06/21 1205)     LOS: 1 day   Time spent: 45 minutes. More than 50% of the time was spent in counseling/coordination of care  Arnetha Courser, MD [facility]  If 7PM-7AM, please contact night-coverage [email or web address]  08/06/2021, 2:31 PM   This record has been created using Conservation officer, historic buildings. Errors have been sought and corrected,but may not always be located. Such creation errors do not reflect on the standard of care.

## 2021-08-06 NOTE — Evaluation (Signed)
Physical Therapy Evaluation Patient Details Name: William Lin MRN: [id number] DOB: December 05, 1970 Today's Date: 08/06/2021  History of Present Illness  Pt is a 50 y/o M with PMH: HTN, DM, depression, anxiety,  and questionable h/o  sickle cell anemia. He presented to ED d/t c/o chest pain. Troponin negative and EKG wtih no significant ST change. Pt found to have pulmonary embolism on CTA of chest (recent h/o long flight). Pt initailly on heparin and now transitioned to Eliquis at time of therapy assessment.  Clinical Impression  Patient resting in bed upon arrival to room; alert and oriented, follows commands and agreeable to session.  "I want to do whatever I have to do to get this behind me".  Rates pain in L chest/shoulder 1/10; progresses to 4/10 with movement.  Maintains L UE in very guarded position with all functional tasks and demonstrates very limited active use throughout session. With isolated testing, does demonstrate active strength at least 3-/5, tolerating shoulder flexion ROM grossly 110-120 degrees at L shoulder.   Able to complete bed mobility with mod indep; sit/stand, basic transfers and gait (100') without assist device, cga/close sup.  Demonstrates very short, choppy steps; guarded trunk posturing with little/no arm swing L > R.  Slow and guarded throughout, mod SOB (sats >95% on 3L); no overt buckling or LOB. Would benefit from skilled PT to address above deficits and promote optimal return to PLOF.; Recommend transition to HHPT upon discharge from acute hospitalization. Additionally, may benefit from transition to cardio/pulmonary rehab once PT/OT complete if symptoms persist.     Recommendations for follow up therapy are one component of a multi-disciplinary discharge planning process, led by the attending physician.  Recommendations may be updated based on patient status, additional functional criteria and insurance authorization.  Follow Up Recommendations Home health PT (may  benefit from cardio/pulmonary rehab once PT complete)    Equipment Recommendations       Recommendations for Other Services       Precautions / Restrictions Precautions Precautions: None Restrictions Weight Bearing Restrictions: No      Mobility  Bed Mobility Overal bed mobility: Modified Independent             General bed mobility comments: increased time    Transfers Overall transfer level: Needs assistance   Transfers: Sit to/from Stand Sit to Stand: Min guard;Supervision         General transfer comment: deferred  Ambulation/Gait Ambulation/Gait assistance: Min guard;Supervision Gait Distance (Feet): 100 Feet Assistive device: None       General Gait Details: very short, choppy steps; guarded trunk posturing with little/no arm swing L > R.  Slow and guarded throughout, mod SOB (sats >95% on 3L); no overt buckling or LOB.  Stairs            Wheelchair Mobility    Modified Rankin (Stroke Patients Only)       Balance Overall balance assessment: Needs assistance Sitting-balance support: Feet supported Sitting balance-Leahy Scale: Good     Standing balance support: No upper extremity supported Standing balance-Leahy Scale: Fair Standing balance comment: deferred                             Pertinent Vitals/Pain Pain Assessment: 0-10 Pain Score: 4  Pain Location: L chest/shoulder; 1/10 at rest, 4/10 with mobility Pain Descriptors / Indicators: Grimacing;Guarding Pain Intervention(s): Limited activity within patient's tolerance;Monitored during session;Repositioned;Patient requesting pain meds-RN notified    Home Living  Family/patient expects to be discharged to:: Private residence Living Arrangements: Spouse/significant other Available Help at Discharge: Family;Available PRN/intermittently Type of Home: House Home Access: Stairs to enter   Entrance Stairs-Number of Steps: 1 Home Layout: One level Home Equipment:  None      Prior Function Level of Independence: Independent         Comments: INDEP at baseline with all I/ADLs and no AD/DME use; working full-time in Careers information officer   Dominant Hand: Right    Extremity/Trunk Assessment   Upper Extremity Assessment Upper Extremity Assessment:  (L shoulder flexion at least 3-/5, approx 110-120 degrees act ROM; L elbow, wrist and hand grossly 4/5, ROM WFL.  Denies sensory deficit.  L UE generally guarded, all planes/positions, due to pain) RUE Deficits / Details: ROM WFL, grip MMT grossly 4+/5 LUE Deficits / Details: shld flexion only tolerated to ~3/4 range. Grip MMT grossly 4-/5    Lower Extremity Assessment Lower Extremity Assessment: Overall WFL for tasks assessed (grossly 4+/5 throughout)       Communication   Communication: No difficulties  Cognition Arousal/Alertness: Awake/alert Behavior During Therapy: WFL for tasks assessed/performed Overall Cognitive Status: Within Functional Limits for tasks assessed                                        General Comments      Exercises Other Exercises Other Exercises: Toilet transfer, ambulatory with RW, cga/close sup; functionally utilizes L UE with increased time/effot, maintaining in very close proximity to body at all times. Other Exercises: Educated in L UE positioning, active and act assist (self assisted) ROM to L shoulder, elbow, wrist and hand; patient voiced understanding.  Would benefit from OT consult to further address; will place order.   Assessment/Plan    PT Assessment Patient needs continued PT services  PT Problem List Decreased strength;Decreased range of motion;Decreased activity tolerance;Decreased mobility;Decreased knowledge of precautions;Decreased knowledge of use of DME;Cardiopulmonary status limiting activity;Pain       PT Treatment Interventions DME instruction;Gait training;Stair training;Functional mobility training;Therapeutic  activities;Patient/family education;Balance training;Therapeutic exercise    PT Goals (Current goals can be found in the Care Plan section)  Acute Rehab PT Goals Patient Stated Goal: to go home PT Goal Formulation: With patient Time For Goal Achievement: 08/20/21 Potential to Achieve Goals: Good    Frequency Min 2X/week   Barriers to discharge        Co-evaluation               AM-PAC PT "6 Clicks" Mobility  Outcome Measure Help needed turning from your back to your side while in a flat bed without using bedrails?: None   Help needed moving to and from a bed to a chair (including a wheelchair)?: None Help needed standing up from a chair using your arms (e.g., wheelchair or bedside chair)?: A Little Help needed to walk in hospital room?: A Little Help needed climbing 3-5 steps with a railing? : A Little 6 Click Score: 17    End of Session   Activity Tolerance: Patient limited by pain;Patient tolerated treatment well Patient left: in chair;with call bell/phone within reach Nurse Communication: Mobility status;Patient requests pain meds PT Visit Diagnosis: Muscle weakness (generalized) (M62.81)    Time: 1351-1420 PT Time Calculation (min) (ACUTE ONLY): 29 min   Charges:   PT Evaluation $PT Eval Moderate Complexity: 1 Mod PT  Treatments $Therapeutic Activity: 8-22 mins        Jejuan Scala H. Manson Passey, PT, DPT, NCS 08/06/21, 8:42 PM [phone]

## 2021-08-06 NOTE — Progress Notes (Signed)
Dr. Nelson Chimes notified that the patient is having some hallucinations.

## 2021-08-06 NOTE — Progress Notes (Signed)
   08/06/21 1954  Assess: MEWS Score  Temp 99.2 F (37.3 C)  BP 120/81  Pulse Rate (!) 115  Resp 18  Level of Consciousness Alert  SpO2 94 %  O2 Device Nasal Cannula  Patient Activity (if Appropriate) In bed  O2 Flow Rate (L/min) 3 L/min  Assess: if the MEWS score is Yellow or Red  Were vital signs taken at a resting state? No  Focused Assessment Change from prior assessment (see assessment flowsheet)  Does the patient meet 2 or more of the SIRS criteria? No  MEWS guidelines implemented *See Row Information* Yes  Treat  MEWS Interventions Administered prn meds/treatments  Pain Scale 0-10  Pain Score 8  Pain Type Acute pain  Pain Location Shoulder  Pain Orientation Posterior  Pain Descriptors / Indicators Dull;Jabbing  Pain Intervention(s) Medication (See eMAR)  Complains of Shortness of breath  Take Vital Signs  Increase Vital Sign Frequency  Yellow: Q 2hr X 2 then Q 4hr X 2, if remains yellow, continue Q 4hrs  Escalate  MEWS: Escalate Yellow: discuss with charge nurse/RN and consider discussing with provider and RRT  Notify: Charge Nurse/RN  Name of Charge Nurse/RN Notified Lillia Abed RN  Date Charge Nurse/RN Notified 08/06/21  Time Charge Nurse/RN Notified 2200  Document  Patient Outcome Other (Comment) (monitoring)  Progress note created (see row info) Yes  Assess: SIRS CRITERIA  SIRS Temperature  0  SIRS Pulse 1  SIRS Respirations  0  SIRS WBC 1  SIRS Score Sum  2

## 2021-08-06 NOTE — Progress Notes (Signed)
Dr. Nelson Chimes notified that CBG is 416. Will not get lab draw but will administer 20 units of Novolog and recheck CBG 1 hour later.

## 2021-08-06 NOTE — Evaluation (Signed)
Occupational Therapy Evaluation Patient Details Name: William Lin MRN: [id number] DOB: 1971-07-30 Today's Date: 08/06/2021   History of Present Illness Pt is a 50 y/o M with PMH: HTN, DM, depression, anxiety,  and questionable h/o  sickle cell anemia. He presented to ED d/t c/o chest pain. Troponin negative and EKG wtih no significant ST change. Pt found to have pulmonary embolism on CTA of chest (recent h/o long flight). Pt initailly on heparin and now transitioned to Eliquis at time of therapy assessment.   Clinical Impression   Pt seen for OT evaluation this date in setting of acute hospitalization d/t PE. Pt reports being INDEP with ADLs/ADL mobility at baseline. He presents this date with decreased fxl activity tolerance and L shoulder/chest pain, limiting his ability to safely and efficiently perform ADLs. He is currently requiring increased time, HOB elevated and use of bed rails to come to EOB sitting. He requires MIN A for seated LB ADLs d/t SOB and limited tolerance for L shoulder ROM. OT engages pt in and family member in below-listed education including importance of ROM. OT will contineu to follow acutely. Anticipate pt could benefit from [facility] f/u to implement home modifications for energy conservation as needed.      Recommendations for follow up therapy are one component of a multi-disciplinary discharge planning process, led by the attending physician.  Recommendations may be updated based on patient status, additional functional criteria and insurance authorization.   Follow Up Recommendations  Home health OT    Equipment Recommendations  Tub/shower seat;3 in 1 bedside commode    Recommendations for Other Services       Precautions / Restrictions Precautions Precautions: Fall Restrictions Weight Bearing Restrictions: No      Mobility Bed Mobility Overal bed mobility: Modified Independent             General bed mobility comments: increased time     Transfers                 General transfer comment: deferred    Balance Overall balance assessment: Mild deficits observed, not formally tested   Sitting balance-Leahy Scale: Good         Standing balance comment: deferred                           ADL either performed or assessed with clinical judgement   ADL                                         General ADL Comments: requires SETUP to MIN A for seated UB ADLs, MIN A for seated LB ADLs.     Vision Baseline Vision/History: 1 Wears glasses Ability to See in Adequate Light: 0 Adequate Patient Visual Report: No change from baseline       Perception     Praxis      Pertinent Vitals/Pain Pain Assessment: 0-10 Pain Score: 1  Pain Location: rates pain in L chest/shoulder as 1/10 with ROM, but grimmaces as though it were much worse with attempts to perform AROM of shoulder. Pain Descriptors / Indicators: Grimacing;Guarding (describes that the pain is as though he's had an injury, more than a description of ischemic rest pain) Pain Intervention(s): Monitored during session;Repositioned (elevated for support of connective tissue)     Hand Dominance Right   Extremity/Trunk Assessment Upper Extremity Assessment  Upper Extremity Assessment: RUE deficits/detail;LUE deficits/detail RUE Deficits / Details: ROM WFL, grip MMT grossly 4+/5 LUE Deficits / Details: shld flexion only tolerated to ~3/4 range. Grip MMT grossly 4-/5   Lower Extremity Assessment Lower Extremity Assessment: Defer to PT evaluation;Overall WFL for tasks assessed       Communication Communication Communication: No difficulties   Cognition Arousal/Alertness: Awake/alert Behavior During Therapy: WFL for tasks assessed/performed Overall Cognitive Status: Within Functional Limits for tasks assessed                                     General Comments       Exercises Other Exercises Other  Exercises: OT engaes pt and family member present in room re: role of OT in acute setting, importance of ROM with L UE to reduce risk for atrophy and shortening of connective tissue. In addition, ed re: rationale of mobilizing to help reduce pain to release synovial fluid.   Shoulder Instructions      Home Living Family/patient expects to be discharged to:: Private residence Living Arrangements: Spouse/significant other Available Help at Discharge: Family;Available PRN/intermittently                         Home Equipment: None          Prior Functioning/Environment Level of Independence: Independent        Comments: INDEP at baseline with all I/ADLs and no AD/DME use        OT Problem List: Decreased strength;Decreased range of motion;Decreased activity tolerance;Cardiopulmonary status limiting activity      OT Treatment/Interventions: Self-care/ADL training;Therapeutic exercise;Energy conservation;DME and/or AE instruction;Therapeutic activities;Patient/family education    OT Goals(Current goals can be found in the care plan section) Acute Rehab OT Goals Patient Stated Goal: to go home OT Goal Formulation: With patient Time For Goal Achievement: 08/20/21 Potential to Achieve Goals: Good ADL Goals Pt/caregiver will Perform Home Exercise Program: Increased ROM;Left upper extremity;With Supervision Additional ADL Goal #1: Pt will verbalize/demo implementation of 3 EC/pacing strategies into ADL routines with no cues  OT Frequency: Min 1X/week   Barriers to D/C:            Co-evaluation              AM-PAC OT "6 Clicks" Daily Activity     Outcome Measure Help from another person eating meals?: None Help from another person taking care of personal grooming?: None Help from another person toileting, which includes using toliet, bedpan, or urinal?: A Little Help from another person bathing (including washing, rinsing, drying)?: A Little Help from another  person to put on and taking off regular upper body clothing?: None Help from another person to put on and taking off regular lower body clothing?: A Little 6 Click Score: 21   End of Session Nurse Communication: Mobility status  Activity Tolerance: Patient tolerated treatment well;Patient limited by pain Patient left: in bed;with call bell/phone within reach;with family/visitor present  OT Visit Diagnosis: Muscle weakness (generalized) (M62.81);Pain Pain - Right/Left: Left Pain - part of body: Shoulder                Time: 5176-1607 OT Time Calculation (min): 13 min Charges:  OT General Charges $OT Visit: 1 Visit OT Evaluation $OT Eval Low Complexity: 1 Low  Rejeana Brock, MS, OTR/L ascom [phone] 08/06/21, 5:57 PM

## 2021-08-07 ENCOUNTER — Inpatient Hospital Stay: Payer: 59

## 2021-08-07 DIAGNOSIS — F418 Other specified anxiety disorders: Secondary | ICD-10-CM | POA: Diagnosis not present

## 2021-08-07 DIAGNOSIS — I2699 Other pulmonary embolism without acute cor pulmonale: Secondary | ICD-10-CM | POA: Diagnosis not present

## 2021-08-07 DIAGNOSIS — R071 Chest pain on breathing: Secondary | ICD-10-CM | POA: Diagnosis not present

## 2021-08-07 DIAGNOSIS — E119 Type 2 diabetes mellitus without complications: Secondary | ICD-10-CM | POA: Diagnosis not present

## 2021-08-07 LAB — CBC
HCT: 28 % — ABNORMAL LOW (ref 39.0–52.0)
Hemoglobin: 10.3 g/dL — ABNORMAL LOW (ref 13.0–17.0)
MCH: 25.4 pg — ABNORMAL LOW (ref 26.0–34.0)
MCHC: 36.8 g/dL — ABNORMAL HIGH (ref 30.0–36.0)
MCV: 69.1 fL — ABNORMAL LOW (ref 80.0–100.0)
Platelets: 200 10*3/uL (ref 150–400)
RBC: 4.05 MIL/uL — ABNORMAL LOW (ref 4.22–5.81)
RDW: 16.1 % — ABNORMAL HIGH (ref 11.5–15.5)
WBC: 16.2 10*3/uL — ABNORMAL HIGH (ref 4.0–10.5)
nRBC: 0 % (ref 0.0–0.2)

## 2021-08-07 LAB — BASIC METABOLIC PANEL
Anion gap: 15 (ref 5–15)
BUN: 9 mg/dL (ref 6–20)
CO2: 25 mmol/L (ref 22–32)
Calcium: 9.2 mg/dL (ref 8.9–10.3)
Chloride: 96 mmol/L — ABNORMAL LOW (ref 98–111)
Creatinine, Ser: 0.62 mg/dL (ref 0.61–1.24)
GFR, Estimated: >60 mL/min (ref >60)
Glucose, Bld: 279 mg/dL — ABNORMAL HIGH (ref 70–99)
Potassium: 3.2 mmol/L — ABNORMAL LOW (ref 3.5–5.1)
Sodium: 136 mmol/L (ref 135–145)

## 2021-08-07 LAB — GLUCOSE, CAPILLARY
Glucose-Capillary: 238 mg/dL — ABNORMAL HIGH (ref 70–99)
Glucose-Capillary: 291 mg/dL — ABNORMAL HIGH (ref 70–99)
Glucose-Capillary: 273 mg/dL — ABNORMAL HIGH (ref 70–99)
Glucose-Capillary: 189 mg/dL — ABNORMAL HIGH (ref 70–99)

## 2021-08-07 MED ORDER — INSULIN GLARGINE-YFGN 100 UNIT/ML ~~LOC~~ SOLN
25.0000 [IU] | Freq: Two times a day (BID) | SUBCUTANEOUS | Status: DC
  Administered 2021-08-07: 25 [IU] via SUBCUTANEOUS
  Filled 2021-08-07 (×3): qty 0.25

## 2021-08-07 MED ORDER — FE FUMARATE-B12-VIT C-FA-IFC PO CAPS
1.0000 | ORAL_CAPSULE | Freq: Three times a day (TID) | ORAL | Status: DC
  Administered 2021-08-07 – 2021-08-12 (×15): 1 via ORAL
  Filled 2021-08-07 (×17): qty 1

## 2021-08-07 NOTE — Progress Notes (Signed)
PROGRESS NOTE    William Lin  VFM:[id number] DOB: 1971-08-12 DOA: 08/03/2021 PCP: [facility] [place]   Brief Narrative: Taken from prior notes. This 50 years old male with PMH significant for hypertension, diabetes, depression, anxiety,  questionable history of sickle cell anemia ( mild variant no anemia never had any crisis ) per patient presented in the ED with chest pain. Patient reports long distance travel recently.  He flew to [place] a week ago and then [place] a month ago.  Patient also reported discomfort in the left calf area which has resolved a week ago.  Patient was found to have elevated D-dimer and was sent in the ED.  CTA chest confirms pulmonary embolism on left basal subsegmental level, no right heart strain.  Patient initially started on heparin infusion as there was no hypoxia he was transitioned to Eliquis.  Bilateral lower extremity Doppler was negative for DVT did show a small superficial thrombophlebitis in left short saphenous vein.  10/21: Patient developed worsening left-sided chest pain, associated with diaphoresis, tachycardia and tachypnea.  EKG without any significant ST changes and troponin remained negative. Morphine followed by Toradol help with the pain. Also consulted PCCM and they showed a concern of acute chest with his questionable history of sickle cell. Contacted sickle cell team at [facility] long, they are not very impressed with acute chest or even sickle cell crisis with no prior history.  Recommended to testing for sickle cell if he has a real diagnosis-labs ordered.  10/22: Smear with target cells and microcytosis more consistent with thalassemia trait.  Talked with patient and according to him he is being followed up at [facility] sickle cell team and had that diagnosis since childhood.  Again denied any crisis.  10/23: Now requiring up to 4 L of oxygen, states CT chest with worsening left pleural effusion and bilateral groundglass  opacities which were new.  Thoracentesis ordered. Pulmonary consult.  Subjective: Patient was seen after getting CT chest.  Continue to have some pleuritic left-sided chest pain but stating that it seems improving.  Eating and drinking okay.  Wife at bedside. We discussed the results of CT chest and explained that thoracentesis, patient and wife agrees to proceed.  Assessment & Plan:   Principal Problem:   Acute pulmonary embolism (HCC) Active Problems:   Diabetes mellitus without complication (HCC)   Depression with anxiety   HTN (hypertension)   Pulmonary embolism (HCC)  Chest pain/questionable sickle cell trait.  EKG without any significant ST changes and troponin remain negative.  Pain improved with morphine followed by Toradol.  Has a pleuritic component and it.  Chest x-ray with some left-sided density and a concern of left-sided pleural effusion. Patient had left basal PE.  Procalcitonin at 1.40.  Leukocytosis. Also contacted PCCM-they were advising to contact sickle cell team due to concern of acute chest.  Patient never had any sickle cell crisis and also not sure about his diagnosis. Contacted sickle cell team at [facility] are not very impressed with his diagnosis at this time and advising just antibiotics and pain control at this time.  Also advised to do some sickle cell labs which were ordered and if came back positive then they would like to see him in their sickle cell clinic.  LDH within normal limit.  Smear with few target cells and microcytosis more consistent with thalassemia trait.  Rest of the labs still pending. Might be pleuritic irritation with pneumonia or pulmonary infarct. -Repeat CT chest with  worsening of left-sided pleural effusion and new bilateral groundglass opacities. -Thoracentesis with labs-ordered -Pulmonary consult. -Continue ceftriaxone and Zithromax. -Continue with pain management -Follow-up hemoglobin fractionation and sickle cell screening  labs-still pending.  Acute anemia.  Hemoglobin with significant drop, although now stable with no obvious bleeding.  Smear with concern of thalassemia trait at this time.  Repeat chest x-ray and CT chest with worsening of effusions, more on left, patient is on Eliquis.  Anemia panel with anemia of chronic disease and appears to have nutritional deficiencies which include iron, folic acid and borderline B12.  Ferritin elevated-not very reliable due to being acute phase reactant. -Start him on supplement -Monitor hemoglobin  Pulmonary embolism.  Most likely provoked as he has a recent history of long flights. -Continue with Eliquis -Continue with supplemental oxygen-wean as tolerated.  Type 2 diabetes mellitus.  CBG elevated.  Uses Actos and metformin at home. -Continue with SSI -Increase Semglee to 25 units twice daily. -Add 6 units for mealtime coverage  Depression anxiety: Continue Zoloft   Hypertension: Continue home lisinopril  Objective: Vitals:   08/07/21 0630 08/07/21 0748 08/07/21 1113 08/07/21 1129  BP: 130/80   125/76  Pulse: (!) 106   (!) 120  Resp: 18   18  Temp: 99.8 F (37.7 C)   98.4 F (36.9 C)  TempSrc: Oral   Oral  SpO2: 97% 95% 95% 98%  Weight:      Height:        Intake/Output Summary (Last 24 hours) at 08/07/2021 1139 Last data filed at 08/07/2021 1127 Gross per 24 hour  Intake 2040 ml  Output 2100 ml  Net -60 ml    Filed Weights   08/03/21 1214  Weight: 99.8 kg    Examination:  General.  Well-developed gentleman, in no acute distress. Pulmonary.  Decreased breath sound at left base, normal respiratory effort. CV.  Regular rate and rhythm, no JVD, rub or murmur. Abdomen.  Soft, nontender, nondistended, BS positive. CNS.  Alert and oriented x3.  No focal neurologic deficit. Extremities.  No edema, no cyanosis, pulses intact and symmetrical. Psychiatry.  Judgment and insight appears normal.   DVT prophylaxis: Eliquis Code Status:  Full Family Communication: Discussed with wife at bedside. Disposition Plan:  Status is: Inpatient  Remains inpatient appropriate because: Of the severity of illness  Level of care: Progressive Cardiac  All the records are reviewed and case discussed with Care Management/Social Worker. Management plans discussed with the patient, nursing and they are in agreement.  Consultants:  PCCM  Procedures:  Antimicrobials:  Ceftriaxone Zithromax  Data Reviewed: I have personally reviewed following labs and imaging studies  CBC: Recent Labs  Lab 08/03/21 1219 08/04/21 0437 08/05/21 0613 08/06/21 0941 08/07/21 1052  WBC 10.2 16.6* 22.2* 15.1* 16.2*  NEUTROABS  --   --   --  12.0*  --   HGB 13.3 12.2* 11.2* 10.1* 10.3*  HCT 37.0* 34.6* 31.1* 27.9* 28.0*  MCV 72.5* 72.8* 71.3* 71.2* 69.1*  PLT 201 189 175 161 200    Basic Metabolic Panel: Recent Labs  Lab 08/03/21 1219 08/05/21 0613 08/07/21 1052  NA 134* 134* 136  K 3.8 3.8 3.2*  CL 98 100 96*  CO2 26 23 25   GLUCOSE 311* 280* 279*  BUN 6 11 9   CREATININE 0.76 0.88 0.62  CALCIUM 9.6 9.3 9.2  MG  --  1.6*  --   PHOS  --  3.7  --     GFR: Estimated Creatinine Clearance:  127.9 mL/min (by C-G formula based on SCr of 0.62 mg/dL). Liver Function Tests: No results for input(s): AST, ALT, ALKPHOS, BILITOT, PROT, ALBUMIN in the last 168 hours. No results for input(s): LIPASE, AMYLASE in the last 168 hours. No results for input(s): AMMONIA in the last 168 hours. Coagulation Profile: No results for input(s): INR, PROTIME in the last 168 hours. Cardiac Enzymes: No results for input(s): CKTOTAL, CKMB, CKMBINDEX, TROPONINI in the last 168 hours. BNP (last 3 results) No results for input(s): PROBNP in the last 8760 hours. HbA1C: No results for input(s): HGBA1C in the last 72 hours.  CBG: Recent Labs  Lab 08/06/21 1255 08/06/21 1630 08/06/21 2224 08/07/21 0814 08/07/21 1128  GLUCAP 369* 214* 176* 238* 291*    Lipid  Profile: No results for input(s): CHOL, HDL, LDLCALC, TRIG, CHOLHDL, LDLDIRECT in the last 72 hours. Thyroid Function Tests: No results for input(s): TSH, T4TOTAL, FREET4, T3FREE, THYROIDAB in the last 72 hours. Anemia Panel: Recent Labs    08/05/21 1440 08/06/21 0941  VITAMINB12 290  --   FOLATE  --  5.7*  FERRITIN  --  920*  TIBC  --  230*  IRON  --  20*  RETICCTPCT  --  1.5   Sepsis Labs: Recent Labs  Lab 08/05/21 1440  PROCALCITON 1.40     Recent Results (from the past 240 hour(s))  Resp Panel by RT-PCR (Flu A&B, Covid) Nasopharyngeal Swab     Status: None   Collection Time: 08/03/21  3:25 PM   Specimen: Nasopharyngeal Swab; Nasopharyngeal(NP) swabs in vial transport medium  Result Value Ref Range Status   SARS Coronavirus 2 by RT PCR NEGATIVE NEGATIVE Final    Comment: (NOTE) SARS-CoV-2 target nucleic acids are NOT DETECTED.  The SARS-CoV-2 RNA is generally detectable in upper respiratory specimens during the acute phase of infection. The lowest concentration of SARS-CoV-2 viral copies this assay can detect is 138 copies/mL. A negative result does not preclude SARS-Cov-2 infection and should not be used as the sole basis for treatment or other patient management decisions. A negative result may occur with  improper specimen collection/handling, submission of specimen other than nasopharyngeal swab, presence of viral mutation(s) within the areas targeted by this assay, and inadequate number of viral copies(<138 copies/mL). A negative result must be combined with clinical observations, patient history, and epidemiological information. The expected result is Negative.  Fact Sheet for Patients:  BloggerCourse.com  Fact Sheet for Healthcare Providers:  SeriousBroker.it  This test is no t yet approved or cleared by the [place] FDA and  has been authorized for detection and/or diagnosis of SARS-CoV-2 by FDA  under an Emergency Use Authorization (EUA). This EUA will remain  in effect (meaning this test can be used) for the duration of the COVID-19 declaration under Section 564(b)(1) of the Act, 21 U.S.C.section 360bbb-3(b)(1), unless the authorization is terminated  or revoked sooner.       Influenza A by PCR NEGATIVE NEGATIVE Final   Influenza B by PCR NEGATIVE NEGATIVE Final    Comment: (NOTE) The Xpert Xpress SARS-CoV-2/FLU/RSV plus assay is intended as an aid in the diagnosis of influenza from Nasopharyngeal swab specimens and should not be used as a sole basis for treatment. Nasal washings and aspirates are unacceptable for Xpert Xpress SARS-CoV-2/FLU/RSV testing.  Fact Sheet for Patients: BloggerCourse.com  Fact Sheet for Healthcare Providers: SeriousBroker.it  This test is not yet approved or cleared by the [place] FDA and has been authorized for detection and/or diagnosis  of SARS-CoV-2 by FDA under an Emergency Use Authorization (EUA). This EUA will remain in effect (meaning this test can be used) for the duration of the COVID-19 declaration under Section 564(b)(1) of the Act, 21 U.S.C. section 360bbb-3(b)(1), unless the authorization is terminated or revoked.  Performed at [facility], [address] [place], [place] [address]       Radiology Studies: DG Chest 2 View  Result Date: 08/06/2021 CLINICAL DATA:  51 year old male with shortness of breath, pulmonary embolism. EXAM: CHEST - 2 VIEW COMPARISON:  08/05/2021, 08/03/2021 FINDINGS: The mediastinal contours are partially obscured, unchanged. No cardiomegaly. Similar elevation left hemidiaphragm with left basilar consolidative opacity in obscuration of the left costophrenic angle. Subsegmental atelectasis in the right lung base. No acute osseous abnormality. IMPRESSION: Similar appearing left basilar opacity. Differential continues to include infarction,  effusion, or mucous plugging. Repeat chest CT may be more illustratrative as clinically appropriate. Electronically Signed   By: Marliss Coots M.D.   On: 08/06/2021 15:32   CT CHEST WO CONTRAST  Result Date: 08/07/2021 CLINICAL DATA:  Shortness of breath and left-sided chest pain. EXAM: CT CHEST WITHOUT CONTRAST TECHNIQUE: Multidetector CT imaging of the chest was performed following the standard protocol without IV contrast. COMPARISON:  Chest radiograph dated 08/06/2021 and CT chest dated 08/03/2021. FINDINGS: Cardiovascular: No significant vascular findings. Normal heart size. No pericardial effusion. Mediastinum/Nodes: No enlarged mediastinal or axillary lymph nodes. Thyroid gland, trachea, and esophagus demonstrate no significant findings. Lungs/Pleura: There are large left and small right pleural effusions with associated atelectasis, new since 08/03/2021. Moderate ground-glass opacities are seen in both upper lobes with areas of consolidation in the left lower and left upper lobes, new since 08/03/2021. There is no pneumothorax. Upper Abdomen: No acute abnormality. Musculoskeletal: No chest wall mass or suspicious bone lesions identified. IMPRESSION: 1. Large left and small right pleural effusions with associated atelectasis. 2. Moderate ground-glass opacities in both lungs may represent pneumonia. Electronically Signed   By: Romona Curls M.D.   On: 08/07/2021 10:24   US Venous Img Upper Bilat (DVT)  Result Date: 08/07/2021 CLINICAL DATA:  50 year old male with history of pulmonary embolism. EXAM: BILATERAL UPPER EXTREMITY VENOUS DOPPLER ULTRASOUND TECHNIQUE: Gray-scale sonography with graded compression, as well as color Doppler and duplex ultrasound were performed to evaluate the bilateral upper extremity deep venous systems from the level of the subclavian vein and including the jugular, axillary, basilic, radial, ulnar and upper cephalic vein. Spectral Doppler was utilized to evaluate flow at  rest and with distal augmentation maneuvers. COMPARISON:  None. FINDINGS: RIGHT UPPER EXTREMITY Internal Jugular Vein: No evidence of thrombus. Normal compressibility, respiratory phasicity and response to augmentation. Subclavian Vein: No evidence of thrombus. Normal compressibility, respiratory phasicity and response to augmentation. Axillary Vein: No evidence of thrombus. Normal compressibility, respiratory phasicity and response to augmentation. Cephalic Vein: No evidence of thrombus. Normal compressibility, respiratory phasicity and response to augmentation. Basilic Vein: No evidence of thrombus. Normal compressibility, respiratory phasicity and response to augmentation. Brachial Veins: No evidence of thrombus. Normal compressibility, respiratory phasicity and response to augmentation. Radial Veins: No evidence of thrombus. Normal compressibility, respiratory phasicity and response to augmentation. Ulnar Veins: No evidence of thrombus. Normal compressibility, respiratory phasicity and response to augmentation. Other Findings:  None. LEFT UPPER EXTREMITY Internal Jugular Vein: No evidence of thrombus. Normal compressibility, respiratory phasicity and response to augmentation. Subclavian Vein: No evidence of thrombus. Normal compressibility, respiratory phasicity and response to augmentation. Axillary Vein: No evidence of thrombus. Normal compressibility,  respiratory phasicity and response to augmentation. Cephalic Vein: No evidence of thrombus. Normal compressibility, respiratory phasicity and response to augmentation. Basilic Vein: No evidence of thrombus. Normal compressibility, respiratory phasicity and response to augmentation. Brachial Veins: No evidence of thrombus. Normal compressibility, respiratory phasicity and response to augmentation. Radial Veins: No evidence of thrombus. Normal compressibility, respiratory phasicity and response to augmentation. Ulnar Veins: No evidence of thrombus. Normal  compressibility, respiratory phasicity and response to augmentation. Other Findings:  None. IMPRESSION: No evidence of deep vein thrombosis within either upper extremity. Marliss Coots, MD [facility] Electronically Signed   By: Marliss Coots M.D.   On: 08/07/2021 10:50    Scheduled Meds:  apixaban  10 mg Oral BID   Followed by   Melene Muller ON 08/11/2021] apixaban  5 mg Oral BID   ferrous fumarate-b12-vitamic C-folic acid  1 capsule Oral TID PC   insulin aspart  0-15 Units Subcutaneous TID WC   insulin aspart  0-5 Units Subcutaneous QHS   insulin aspart  6 Units Subcutaneous TID WC   insulin glargine-yfgn  25 Units Subcutaneous BID   ipratropium-albuterol  3 mL Nebulization Q4H   lisinopril  5 mg Oral Daily   sertraline  100 mg Oral Daily   Continuous Infusions:  azithromycin 500 mg (08/06/21 1257)   cefTRIAXone (ROCEPHIN)  IV 2 g (08/07/21 1116)     LOS: 2 days   Time spent: 40 minutes. More than 50% of the time was spent in counseling/coordination of care  Arnetha Courser, MD [facility]  If 7PM-7AM, please contact night-coverage [email or web address]  08/07/2021, 11:39 AM   This record has been created using Conservation officer, historic buildings. Errors have been sought and corrected,but may not always be located. Such creation errors do not reflect on the standard of care.

## 2021-08-07 NOTE — Consult Note (Signed)
Pulmonary Medicine          Date: 08/07/2021,   MRN# [id number] William Lin 06/18/1971     AdmissionWeight: 99.8 kg                 CurrentWeight: 99.8 kg   Reffering physician: Dr Reesa Chew    CHIEF COMPLAINT:   Acute hypoxemic respiratory failure   HISTORY OF PRESENT ILLNESS   This is a 50 yo M with hx of DM, MDD, anxiety , pleural effusions, chronic anemia of unclear diagnosis possible genetic came in with complaints of chest discomfort which was severe and with left arm radiation. He denies URI symptoms, sick contacts , NVD, admits to cross country travel to [place] and [place].  He had CT angio done showing Left sided PE's no RH strain. His H/h started to trend down and patient developed pleural effusion. PCCM for further evaluation and management. Hes on 3-4L/min [place] for supplemental O2.  He is eating and going to bathroom ok.  I met with sister [name] of patient and spoke to wife [name] on phone.   PAST MEDICAL HISTORY   Past Medical History:  Diagnosis Date  . Anxiety   . Diabetes mellitus without complication ([place])   . HTN (hypertension)   . Sickle cell anemia ([facility])      SURGICAL HISTORY   Past Surgical History:  Procedure Laterality Date  . CHOLECYSTECTOMY    . DENTAL SURGERY    . polynodial cyst    . ROTATOR CUFF REPAIR     Left     FAMILY HISTORY   Family History  Problem Relation Age of Onset  . Hypertension Other   . Hypertension Mother   . Arthritis Mother      SOCIAL HISTORY   Social History   Tobacco Use  . Smoking status: Never  . Smokeless tobacco: Never  Substance Use Topics  . Alcohol use: No  . Drug use: Never     MEDICATIONS    Home Medication:    Current Medication:  Current Facility-Administered Medications:  .  acetaminophen (TYLENOL) tablet 650 mg, 650 mg, Oral, Q6H PRN, Ivor Costa, MD, 650 mg at 08/05/21 2142 .  albuterol (VENTOLIN HFA) 108 (90 Base) MCG/ACT inhaler 2 puff, 2 puff, Inhalation, Q4H PRN,  Ivor Costa, MD .  apixaban (ELIQUIS) tablet 10 mg, 10 mg, Oral, BID, 10 mg at 08/07/21 0828 **FOLLOWED BY** [START ON 08/11/2021] apixaban (ELIQUIS) tablet 5 mg, 5 mg, Oral, BID, Nazari, Walid A, RPH .  azithromycin (ZITHROMAX) 500 mg in sodium chloride 0.9 % 250 mL IVPB, 500 mg, Intravenous, Q24H, Amin, Sumayya, MD, Last Rate: 250 mL/hr at 08/06/21 1257, 500 mg at 08/06/21 1257 .  cefTRIAXone (ROCEPHIN) 2 g in sodium chloride 0.9 % 100 mL IVPB, 2 g, Intravenous, Q24H, Amin, Sumayya, MD, Last Rate: 200 mL/hr at 08/06/21 1205, 2 g at 08/06/21 1205 .  dextromethorphan-guaiFENesin (MUCINEX DM) 30-600 MG per 12 hr tablet 1 tablet, 1 tablet, Oral, BID PRN, Ivor Costa, MD, 1 tablet at 08/07/21 [phone] .  hydrALAZINE (APRESOLINE) injection 5 mg, 5 mg, Intravenous, Q2H PRN, Ivor Costa, MD .  HYDROmorphone (DILAUDID) injection 1 mg, 1 mg, Intravenous, Q4H PRN, [name], MD, 1 mg at 08/06/21 1946 .  insulin aspart (novoLOG) injection 0-15 Units, 0-15 Units, Subcutaneous, TID WC, [name], MD, 5 Units at 08/07/21 0827 .  insulin aspart (novoLOG) injection 0-5 Units, 0-5 Units, Subcutaneous, QHS, Ivor Costa, MD, 4 Units at 08/05/21 2215 .  insulin aspart (novoLOG) injection 6 Units, 6 Units, Subcutaneous, TID WC, [name], MD, 6 Units at 08/07/21 0827 .  insulin glargine-yfgn (SEMGLEE) injection 25 Units, 25 Units, Subcutaneous, BID, Amin, Sumayya, MD .  ipratropium-albuterol (DUONEB) 0.5-2.5 (3) MG/3ML nebulizer solution 3 mL, 3 mL, Nebulization, Q4H, Amin, Sumayya, MD, 3 mL at 08/07/21 0748 .  lisinopril (ZESTRIL) tablet 5 mg, 5 mg, Oral, Daily, Ivor Costa, MD, 5 mg at 08/07/21 4076 .  nitroGLYCERIN (NITROSTAT) SL tablet 0.4 mg, 0.4 mg, Sublingual, Q5 min PRN, [name], MD, 0.4 mg at 08/05/21 0852 .  ondansetron (ZOFRAN) injection 4 mg, 4 mg, Intravenous, Q8H PRN, Ivor Costa, MD, 4 mg at 08/05/21 1641 .  oxyCODONE-acetaminophen (PERCOCET/ROXICET) 5-325 MG per tablet 1 tablet, 1 tablet, Oral,  Q4H PRN, Ivor Costa, MD, 1 tablet at 08/07/21 0458 .  sertraline (ZOLOFT) tablet 100 mg, 100 mg, Oral, Daily, Ivor Costa, MD, 100 mg at 08/07/21 8088    ALLERGIES   Aspirin and Sulfonamide derivatives     REVIEW OF SYSTEMS    Review of Systems:  Gen:  Denies  fever, sweats, chills weigh loss  HEENT: Denies blurred vision, double vision, ear pain, eye pain, hearing loss, nose bleeds, sore throat Cardiac:  No dizziness, chest pain or heaviness, chest tightness,edema Resp:   Denies cough or sputum porduction, shortness of breath,wheezing, hemoptysis,  Gi: Denies swallowing difficulty, stomach pain, nausea or vomiting, diarrhea, constipation, bowel incontinence Gu:  Denies bladder incontinence, burning urine Ext:   Denies Joint pain, stiffness or swelling Skin: Denies  skin rash, easy bruising or bleeding or hives Endoc:  Denies polyuria, polydipsia , polyphagia or weight change Psych:   Denies depression, insomnia or hallucinations   Other:  All other systems negative   VS: BP 130/80 (BP Location: Left Arm)   Pulse (!) 106   Temp 99.8 F (37.7 C) (Oral)   Resp 18   Ht _0  (1.778 m)   Wt 99.8 kg   SpO2 95%   BMI 31.57 kg/m      PHYSICAL EXAM    GENERAL:NAD, no fevers, chills, no weakness no fatigue HEAD: Normocephalic, atraumatic.  EYES: Pupils equal, round, reactive to light. Extraocular muscles intact. No scleral icterus.  MOUTH: Moist mucosal membrane. Dentition intact. No abscess noted.  EAR, NOSE, THROAT: Clear without exudates. No external lesions.  NECK: Supple. No thyromegaly. No nodules. No JVD.  PULMONARY: bilateral rhonchi worse at left base  CARDIOVASCULAR: S1 and S2. Regular rate and rhythm. No murmurs, rubs, or gallops. No edema. Pedal pulses 2+ bilaterally.  GASTROINTESTINAL: Soft, nontender, nondistended. No masses. Positive bowel sounds. No hepatosplenomegaly.  MUSCULOSKELETAL: No swelling, clubbing, or edema. Range of motion full in all  extremities.  NEUROLOGIC: Cranial nerves II through XII are intact. No gross focal neurological deficits. Sensation intact. Reflexes intact.  SKIN: No ulceration, lesions, rashes, or cyanosis. Skin warm and dry. Turgor intact.  PSYCHIATRIC: Mood, affect within normal limits. The patient is awake, alert and oriented x 3. Insight, judgment intact.       IMAGING    DG Chest 2 View  Result Date: 08/06/2021 CLINICAL DATA:  50 year old male with shortness of breath, pulmonary embolism. EXAM: CHEST - 2 VIEW COMPARISON:  08/05/2021, 08/03/2021 FINDINGS: The mediastinal contours are partially obscured, unchanged. No cardiomegaly. Similar elevation left hemidiaphragm with left basilar consolidative opacity in obscuration of the left costophrenic angle. Subsegmental atelectasis in the right lung base. No acute osseous abnormality. IMPRESSION: Similar appearing left basilar opacity. Differential continues  to include infarction, effusion, or mucous plugging. Repeat chest CT may be more illustratrative as clinically appropriate. Electronically Signed   By: Ruthann Cancer M.D.   On: 08/06/2021 15:32   CT CHEST WO CONTRAST  Result Date: 08/07/2021 CLINICAL DATA:  Shortness of breath and left-sided chest pain. EXAM: CT CHEST WITHOUT CONTRAST TECHNIQUE: Multidetector CT imaging of the chest was performed following the standard protocol without IV contrast. COMPARISON:  Chest radiograph dated 08/06/2021 and CT chest dated 08/03/2021. FINDINGS: Cardiovascular: No significant vascular findings. Normal heart size. No pericardial effusion. Mediastinum/Nodes: No enlarged mediastinal or axillary lymph nodes. Thyroid gland, trachea, and esophagus demonstrate no significant findings. Lungs/Pleura: There are large left and small right pleural effusions with associated atelectasis, new since 08/03/2021. Moderate ground-glass opacities are seen in both upper lobes with areas of consolidation in the left lower and left upper  lobes, new since 08/03/2021. There is no pneumothorax. Upper Abdomen: No acute abnormality. Musculoskeletal: No chest wall mass or suspicious bone lesions identified. IMPRESSION: 1. Large left and small right pleural effusions with associated atelectasis. 2. Moderate ground-glass opacities in both lungs may represent pneumonia. Electronically Signed   By: Zerita Boers M.D.   On: 08/07/2021 10:24   CT Angio Chest PE W/Cm &/Or Wo Cm  Result Date: 08/03/2021 CLINICAL DATA:  Evaluate for pulmonary embolus EXAM: CT ANGIOGRAPHY CHEST WITH CONTRAST TECHNIQUE: Multidetector CT imaging of the chest was performed using the standard protocol during bolus administration of intravenous contrast. Multiplanar CT image reconstructions and MIPs were obtained to evaluate the vascular anatomy. CONTRAST:  40m OMNIPAQUE IOHEXOL 350 MG/ML SOLN COMPARISON:  Chest CT dated July 25, 2011 FINDINGS: Cardiovascular: Adequate opacification of the pulmonary arteries. Intraluminal filling defect of segmental and subsegmental left lower lobe pulmonary arteries. Normal heart size with no CT evidence of right heart strain. No pericardial effusion. No significant coronary artery calcifications or atherosclerotic disease of the thoracic aorta. Mediastinum/Nodes: Esophagus and thyroid are unremarkable. No pathologically enlarged lymph nodes seen in the chest. Lungs/Pleura: Central airways are patent. Left lower lobe linear opacities, likely due to scarring or atelectasis. No consolidation, pleural effusion or pneumothorax. Upper Abdomen: No acute abnormality. Musculoskeletal: No chest wall abnormality. No acute or significant osseous findings. Review of the MIP images confirms the above findings. IMPRESSION: Pulmonary embolus of segmental and subsegmental left lower lobe pulmonary arteries. No CT evidence of right heart strain. Critical Value/emergent results were called by telephone at the time of interpretation on 08/03/2021 at 1:58 pm to  provider IEllender Hose MD, who verbally acknowledged these results. Electronically Signed   By: LYetta GlassmanM.D.   On: 08/03/2021 13:59   [place]Venous Img Lower Bilateral (DVT)  Result Date: 08/03/2021 CLINICAL DATA:  Pulmonary embolism. EXAM: BILATERAL LOWER EXTREMITY VENOUS DOPPLER ULTRASOUND TECHNIQUE: Gray-scale sonography with graded compression, as well as color Doppler and duplex ultrasound were performed to evaluate the lower extremity deep venous systems from the level of the common femoral vein and including the common femoral, femoral, profunda femoral, popliteal and calf veins including the posterior tibial, peroneal and gastrocnemius veins when visible. The superficial great saphenous vein was also interrogated. Spectral Doppler was utilized to evaluate flow at rest and with distal augmentation maneuvers in the common femoral, femoral and popliteal veins. COMPARISON:  None. FINDINGS: RIGHT LOWER EXTREMITY Common Femoral Vein: No evidence of thrombus. Normal compressibility, respiratory phasicity and response to augmentation. Saphenofemoral Junction: No evidence of thrombus. Normal compressibility and flow on color Doppler imaging. Profunda Femoral Vein: No evidence  of thrombus. Normal compressibility and flow on color Doppler imaging. Femoral Vein: No evidence of thrombus. Normal compressibility, respiratory phasicity and response to augmentation. Popliteal Vein: No evidence of thrombus. Normal compressibility, respiratory phasicity and response to augmentation. Calf Veins: No evidence of thrombus. Normal compressibility and flow on color Doppler imaging. Superficial Great Saphenous Vein: No evidence of thrombus. Normal compressibility. Venous Reflux:  None. Other Findings: No evidence of superficial thrombophlebitis or abnormal fluid collection. LEFT LOWER EXTREMITY Common Femoral Vein: No evidence of thrombus. Normal compressibility, respiratory phasicity and response to augmentation. Saphenofemoral  Junction: No evidence of thrombus. Normal compressibility and flow on color Doppler imaging. Profunda Femoral Vein: No evidence of thrombus. Normal compressibility and flow on color Doppler imaging. Femoral Vein: No evidence of thrombus. Normal compressibility, respiratory phasicity and response to augmentation. Popliteal Vein: No evidence of thrombus. Normal compressibility, respiratory phasicity and response to augmentation. Calf Veins: No evidence of thrombus. Normal compressibility and flow on color Doppler imaging. Superficial Great Saphenous Vein: No evidence of thrombus. Normal compressibility. Venous Reflux:  None. Other Findings: There is thrombus noted in the left short saphenous vein behind the knee and extending into the calf. This thrombus does not visibly extend into the left popliteal vein. IMPRESSION: No evidence of deep venous thrombosis in either lower extremity. Superficial thrombophlebitis noted in the left short saphenous vein. Electronically Signed   By: Aletta Edouard M.D.   On: 08/03/2021 16:59   US Venous Img Upper Bilat (DVT)  Result Date: 08/07/2021 CLINICAL DATA:  50 year old male with history of pulmonary embolism. EXAM: BILATERAL UPPER EXTREMITY VENOUS DOPPLER ULTRASOUND TECHNIQUE: Gray-scale sonography with graded compression, as well as color Doppler and duplex ultrasound were performed to evaluate the bilateral upper extremity deep venous systems from the level of the subclavian vein and including the jugular, axillary, basilic, radial, ulnar and upper cephalic vein. Spectral Doppler was utilized to evaluate flow at rest and with distal augmentation maneuvers. COMPARISON:  None. FINDINGS: RIGHT UPPER EXTREMITY Internal Jugular Vein: No evidence of thrombus. Normal compressibility, respiratory phasicity and response to augmentation. Subclavian Vein: No evidence of thrombus. Normal compressibility, respiratory phasicity and response to augmentation. Axillary Vein: No evidence of  thrombus. Normal compressibility, respiratory phasicity and response to augmentation. Cephalic Vein: No evidence of thrombus. Normal compressibility, respiratory phasicity and response to augmentation. Basilic Vein: No evidence of thrombus. Normal compressibility, respiratory phasicity and response to augmentation. Brachial Veins: No evidence of thrombus. Normal compressibility, respiratory phasicity and response to augmentation. Radial Veins: No evidence of thrombus. Normal compressibility, respiratory phasicity and response to augmentation. Ulnar Veins: No evidence of thrombus. Normal compressibility, respiratory phasicity and response to augmentation. Other Findings:  None. LEFT UPPER EXTREMITY Internal Jugular Vein: No evidence of thrombus. Normal compressibility, respiratory phasicity and response to augmentation. Subclavian Vein: No evidence of thrombus. Normal compressibility, respiratory phasicity and response to augmentation. Axillary Vein: No evidence of thrombus. Normal compressibility, respiratory phasicity and response to augmentation. Cephalic Vein: No evidence of thrombus. Normal compressibility, respiratory phasicity and response to augmentation. Basilic Vein: No evidence of thrombus. Normal compressibility, respiratory phasicity and response to augmentation. Brachial Veins: No evidence of thrombus. Normal compressibility, respiratory phasicity and response to augmentation. Radial Veins: No evidence of thrombus. Normal compressibility, respiratory phasicity and response to augmentation. Ulnar Veins: No evidence of thrombus. Normal compressibility, respiratory phasicity and response to augmentation. Other Findings:  None. IMPRESSION: No evidence of deep vein thrombosis within either upper extremity. Ruthann Cancer, MD [facility] Radiology Electronically  Signed   By: Ruthann Cancer M.D.   On: 08/07/2021 10:50   DG Chest Port 1 View  Result Date:  08/05/2021 CLINICAL DATA:  50 year old male shortness of breath. Chest pain and diaphoresis. Positive left pulmonary embolus on recent CTA. EXAM: PORTABLE CHEST 1 VIEW COMPARISON:  CTA chest 08/03/2021 and earlier. FINDINGS: Portable AP upright view at 0905 hours. Lower lung volumes with increasing dense opacity at the left lung base most resembling pleural effusion. No pulmonary edema or pneumothorax. Visible mediastinal contours remain stable. Patchy increased right lung opacity more resembling atelectasis. No acute osseous abnormality identified. Negative visible bowel gas. IMPRESSION: Lower lung volumes with increasing dense opacification at the left lung base. Favor acute pleural effusion, although pulmonary infarct not excluded in this setting. Increased right lung base atelectasis. Electronically Signed   By: Genevie Ann M.D.   On: 08/05/2021 09:37      ASSESSMENT/PLAN   Acute on chronic hypoxemic respiratory failure  -S/p PE and with pleural effusion    -have discused with patient and family   - patient is on eliquis    Will check fluid studies including culture to evaluate for hemothorax vs empyema    Atelectasis of left lung  - Bronchopulmonary hygiene    Left lung bronchopneumonia - on zithromax and rocephin     Thank you for allowing me to participate in the care of this patient.   Total face to face encounter time for this patient visit was >45 min. >50% of the time was  spent in counseling and coordination of care.    Patient/Family are satisfied with care plan and all questions have been answered.   This document was prepared using Dragon voice recognition software and may include unintentional dictation errors.     Ottie Glazier, M.D.  Division of [place]

## 2021-08-08 ENCOUNTER — Inpatient Hospital Stay: Payer: 59

## 2021-08-08 DIAGNOSIS — F418 Other specified anxiety disorders: Secondary | ICD-10-CM | POA: Diagnosis not present

## 2021-08-08 DIAGNOSIS — E119 Type 2 diabetes mellitus without complications: Secondary | ICD-10-CM | POA: Diagnosis not present

## 2021-08-08 DIAGNOSIS — I2699 Other pulmonary embolism without acute cor pulmonale: Secondary | ICD-10-CM | POA: Diagnosis not present

## 2021-08-08 DIAGNOSIS — R071 Chest pain on breathing: Secondary | ICD-10-CM | POA: Diagnosis not present

## 2021-08-08 LAB — CBC WITH DIFFERENTIAL/PLATELET
Abs Immature Granulocytes: 0.28 10*3/uL — ABNORMAL HIGH (ref 0.00–0.07)
Basophils Absolute: 0 10*3/uL (ref 0.0–0.1)
Basophils Relative: 0 %
Eosinophils Absolute: 0.2 10*3/uL (ref 0.0–0.5)
Eosinophils Relative: 2 %
HCT: 26.6 % — ABNORMAL LOW (ref 39.0–52.0)
Hemoglobin: 9.7 g/dL — ABNORMAL LOW (ref 13.0–17.0)
Immature Granulocytes: 2 %
Lymphocytes Relative: 11 %
Lymphs Abs: 1.4 10*3/uL (ref 0.7–4.0)
MCH: 25.3 pg — ABNORMAL LOW (ref 26.0–34.0)
MCHC: 36.5 g/dL — ABNORMAL HIGH (ref 30.0–36.0)
MCV: 69.5 fL — ABNORMAL LOW (ref 80.0–100.0)
Monocytes Absolute: 1.2 10*3/uL — ABNORMAL HIGH (ref 0.1–1.0)
Monocytes Relative: 9 %
Neutro Abs: 9.8 10*3/uL — ABNORMAL HIGH (ref 1.7–7.7)
Neutrophils Relative %: 76 %
Platelets: 200 10*3/uL (ref 150–400)
RBC: 3.83 MIL/uL — ABNORMAL LOW (ref 4.22–5.81)
RDW: 16.4 % — ABNORMAL HIGH (ref 11.5–15.5)
Smear Review: NORMAL
WBC: 12.9 10*3/uL — ABNORMAL HIGH (ref 4.0–10.5)
nRBC: 0 % (ref 0.0–0.2)

## 2021-08-08 LAB — GLUCOSE, CAPILLARY
Glucose-Capillary: 250 mg/dL — ABNORMAL HIGH (ref 70–99)
Glucose-Capillary: 278 mg/dL — ABNORMAL HIGH (ref 70–99)
Glucose-Capillary: 261 mg/dL — ABNORMAL HIGH (ref 70–99)
Glucose-Capillary: 144 mg/dL — ABNORMAL HIGH (ref 70–99)

## 2021-08-08 LAB — SICKLE CELL SCREEN: Sickle Cell Screen: POSITIVE — AB

## 2021-08-08 MED ORDER — SODIUM CHLORIDE 0.9 % IV SOLN: INTRAVENOUS | Status: DC

## 2021-08-08 MED ORDER — INSULIN ASPART 100 UNIT/ML IJ SOLN
8.0000 [IU] | Freq: Three times a day (TID) | INTRAMUSCULAR | Status: DC
  Administered 2021-08-08 – 2021-08-12 (×10): 8 [IU] via SUBCUTANEOUS
  Filled 2021-08-08 (×9): qty 1

## 2021-08-08 MED ORDER — INSULIN GLARGINE-YFGN 100 UNIT/ML ~~LOC~~ SOLN
30.0000 [IU] | Freq: Two times a day (BID) | SUBCUTANEOUS | Status: DC
  Administered 2021-08-08 – 2021-08-12 (×8): 30 [IU] via SUBCUTANEOUS
  Filled 2021-08-08 (×10): qty 0.3

## 2021-08-08 MED ORDER — LIVING WELL WITH DIABETES BOOK
Freq: Once | Status: AC
  Filled 2021-08-08 (×2): qty 1

## 2021-08-08 MED ORDER — LISINOPRIL 10 MG PO TABS
10.0000 mg | ORAL_TABLET | Freq: Every day | ORAL | Status: DC
  Administered 2021-08-08 – 2021-08-12 (×5): 10 mg via ORAL
  Filled 2021-08-08 (×5): qty 1

## 2021-08-08 NOTE — Progress Notes (Signed)
Patient presented to IR/US for possible image-guided thoracentesis. Limited ultrasound evaluation did not reveal sufficient fluid for safe percutaneous intervention. No procedure performed. Dr. Juliette Alcide aware. Images saved in Epic.  Alwyn Ren, [place] [phone] 08/08/2021, 4:02 PM

## 2021-08-08 NOTE — Progress Notes (Addendum)
PROGRESS NOTE    William Lin  MWU:[id number] DOB: May 29, 1971 DOA: 08/03/2021 PCP: [facility] @ [place]   Brief Narrative: Taken from prior notes. This 50 years old male with PMH significant for hypertension, diabetes, depression, anxiety,  questionable history of sickle cell anemia ( mild variant no anemia never had any crisis ) per patient presented in the ED with chest pain. Patient reports long distance travel recently.  He flew to [place] a week ago and then [place] a month ago.  Patient also reported discomfort in the left calf area which has resolved a week ago.  Patient was found to have elevated D-dimer and was sent in the ED.  CTA chest confirms pulmonary embolism on left basal subsegmental level, no right heart strain.  Patient initially started on heparin infusion as there was no hypoxia he was transitioned to Eliquis.  Bilateral lower extremity Doppler was negative for DVT did show a small superficial thrombophlebitis in left short saphenous vein.  10/21: Patient developed worsening left-sided chest pain, associated with diaphoresis, tachycardia and tachypnea.  EKG without any significant ST changes and troponin remained negative. Morphine followed by Toradol help with the pain. Also consulted PCCM and they showed a concern of acute chest with his questionable history of sickle cell. Contacted sickle cell team at [facility] long, they are not very impressed with acute chest or even sickle cell crisis with no prior history.  Recommended to testing for sickle cell if he has a real diagnosis-labs ordered.  10/22: Smear with target cells and microcytosis more consistent with thalassemia trait.  Talked with patient and according to him he is being followed up at [facility] sickle cell team and had that diagnosis since childhood.  Again denied any crisis.  10/23: Now requiring up to 4 L of oxygen, states CT chest with worsening left pleural effusion and bilateral groundglass  opacities which were new.  Thoracentesis ordered. Pulmonary consult.  10/24: Patient continued to have pleuritic left-sided chest pain, although improved than before.  Continue to require up to 3 L of oxygen.  Going for thoracentesis today.  Vascular surgery was consulted by pulmonology to see if any endovascular intervention needed.  Subjective: Patient was sitting in chair when seen today.  Continues to have pleuritic left-sided chest pain, although stating that it is better than before.  Continues to require oxygen.  Wife at bedside.  Assessment & Plan:   Principal Problem:   Acute pulmonary embolism (HCC) Active Problems:   Diabetes mellitus without complication (HCC)   Depression with anxiety   HTN (hypertension)   Pulmonary embolism (HCC)  Chest pain/questionable sickle cell trait.  EKG without any significant ST changes and troponin remain negative.  Pain improved with morphine followed by Toradol.  Has a pleuritic component and it.  Chest x-ray with some left-sided density and a concern of left-sided pleural effusion. Patient had left basal PE.  Procalcitonin at 1.40.  Leukocytosis. Also contacted PCCM-they were advising to contact sickle cell team due to concern of acute chest.  Patient never had any sickle cell crisis and also not sure about his diagnosis. Contacted sickle cell team at [facility] are not very impressed with his diagnosis at this time and advising just antibiotics and pain control at this time.  Also advised to do some sickle cell labs which were ordered and if came back positive then they would like to see him in their sickle cell clinic.  LDH within normal limit.  Smear with few target  cells and microcytosis more consistent with thalassemia trait.  Rest of the labs still pending. Might be pleuritic irritation with pneumonia or pulmonary infarct. -Repeat CT chest with worsening of left-sided pleural effusion and new bilateral groundglass  opacities. -Thoracentesis with labs-ordered, will be done later today. -Pulmonary consult. -Continue ceftriaxone and Zithromax. -Continue with pain management -Follow-up hemoglobin fractionation and sickle cell screening labs-still pending.  Acute anemia.  Hemoglobin with significant drop, today at 9.7, with no obvious bleeding.  Smear with concern of thalassemia trait at this time.  Repeat chest x-ray and CT chest with worsening of effusions, more on left, patient is on Eliquis.  Anemia panel with anemia of chronic disease and appears to have nutritional deficiencies which include iron, folic acid and borderline B12.  Ferritin elevated-not very reliable due to being acute phase reactant. -Continue supplement -Monitor hemoglobin  Pulmonary embolism.  Most likely provoked as he has a recent history of long flights. -Continue with Eliquis -Continue with supplemental oxygen-wean as tolerated.  Type 2 diabetes mellitus.  CBG still elevated.  Uses Actos and metformin at home. -Continue with SSI -Increase Semglee to 30 units twice daily. -Increase mealtime coverage to 8 units.  Depression anxiety: Continue Zoloft   Hypertension: Blood pressure remained mildly elevated even with improvement of pain -Increase the home dose of lisinopril to 10 mg daily.  Objective: Vitals:   08/07/21 2006 08/07/21 2008 08/07/21 2318 08/08/21 0438  BP: (!) 146/85  (!) 150/80 (!) 151/94  Pulse: (!) 114  (!) 108 (!) 105  Resp: 18  18 18   Temp: 99.2 F (37.3 C)  99 F (37.2 C) 98.4 F (36.9 C)  TempSrc:      SpO2: 99% 97% 97% 98%  Weight:      Height:        Intake/Output Summary (Last 24 hours) at 08/08/2021 0831 Last data filed at 08/08/2021 0545 Gross per 24 hour  Intake 1160 ml  Output 1265 ml  Net -105 ml    Filed Weights   08/03/21 1214  Weight: 99.8 kg    Examination:  General.  Well-developed gentleman, in no acute distress. Pulmonary.  Decreased breath sound at bases, normal  respiratory effort. CV.  Regular rate and rhythm, no JVD, rub or murmur. Abdomen.  Soft, nontender, nondistended, BS positive. CNS.  Alert and oriented x3.  No focal neurologic deficit. Extremities.  No edema, no cyanosis, pulses intact and symmetrical. Psychiatry.  Judgment and insight appears normal.   DVT prophylaxis: Eliquis Code Status: Full Family Communication: Discussed with wife at bedside. Disposition Plan:  Status is: Inpatient  Remains inpatient appropriate because: Of the severity of illness  Level of care: Progressive Cardiac  All the records are reviewed and case discussed with Care Management/Social Worker. Management plans discussed with the patient, nursing and they are in agreement.  Consultants:  PCCM  Procedures:  Antimicrobials:  Ceftriaxone Zithromax  Data Reviewed: I have personally reviewed following labs and imaging studies  CBC: Recent Labs  Lab 08/03/21 1219 08/04/21 0437 08/05/21 0613 08/06/21 0941 08/07/21 1052  WBC 10.2 16.6* 22.2* 15.1* 16.2*  NEUTROABS  --   --   --  12.0*  --   HGB 13.3 12.2* 11.2* 10.1* 10.3*  HCT 37.0* 34.6* 31.1* 27.9* 28.0*  MCV 72.5* 72.8* 71.3* 71.2* 69.1*  PLT 201 189 175 161 200    Basic Metabolic Panel: Recent Labs  Lab 08/03/21 1219 08/05/21 0613 08/07/21 1052  NA 134* 134* 136  K 3.8 3.8 3.2*  CL 98 100 96*  CO2 26 23 25   GLUCOSE 311* 280* 279*  BUN 6 11 9   CREATININE 0.76 0.88 0.62  CALCIUM 9.6 9.3 9.2  MG  --  1.6*  --   PHOS  --  3.7  --     GFR: Estimated Creatinine Clearance: 127.9 mL/min (by C-G formula based on SCr of 0.62 mg/dL). Liver Function Tests: No results for input(s): AST, ALT, ALKPHOS, BILITOT, PROT, ALBUMIN in the last 168 hours. No results for input(s): LIPASE, AMYLASE in the last 168 hours. No results for input(s): AMMONIA in the last 168 hours. Coagulation Profile: No results for input(s): INR, PROTIME in the last 168 hours. Cardiac Enzymes: No results for  input(s): CKTOTAL, CKMB, CKMBINDEX, TROPONINI in the last 168 hours. BNP (last 3 results) No results for input(s): PROBNP in the last 8760 hours. HbA1C: No results for input(s): HGBA1C in the last 72 hours.  CBG: Recent Labs  Lab 08/07/21 0814 08/07/21 1128 08/07/21 1635 08/07/21 2113 08/08/21 0800  GLUCAP 238* 291* 273* 189* 250*    Lipid Profile: No results for input(s): CHOL, HDL, LDLCALC, TRIG, CHOLHDL, LDLDIRECT in the last 72 hours. Thyroid Function Tests: No results for input(s): TSH, T4TOTAL, FREET4, T3FREE, THYROIDAB in the last 72 hours. Anemia Panel: Recent Labs    08/05/21 1440 08/06/21 0941  VITAMINB12 290  --   FOLATE  --  5.7*  FERRITIN  --  920*  TIBC  --  230*  IRON  --  20*  RETICCTPCT  --  1.5    Sepsis Labs: Recent Labs  Lab 08/05/21 1440  PROCALCITON 1.40     Recent Results (from the past 240 hour(s))  Resp Panel by RT-PCR (Flu A&B, Covid) Nasopharyngeal Swab     Status: None   Collection Time: 08/03/21  3:25 PM   Specimen: Nasopharyngeal Swab; Nasopharyngeal(NP) swabs in vial transport medium  Result Value Ref Range Status   SARS Coronavirus 2 by RT PCR NEGATIVE NEGATIVE Final    Comment: (NOTE) SARS-CoV-2 target nucleic acids are NOT DETECTED.  The SARS-CoV-2 RNA is generally detectable in upper respiratory specimens during the acute phase of infection. The lowest concentration of SARS-CoV-2 viral copies this assay can detect is 138 copies/mL. A negative result does not preclude SARS-Cov-2 infection and should not be used as the sole basis for treatment or other patient management decisions. A negative result may occur with  improper specimen collection/handling, submission of specimen other than nasopharyngeal swab, presence of viral mutation(s) within the areas targeted by this assay, and inadequate number of viral copies(<138 copies/mL). A negative result must be combined with clinical observations, patient history, and  epidemiological information. The expected result is Negative.  Fact Sheet for Patients:  08/07/21  Fact Sheet for Healthcare Providers:  08/05/21  This test is no t yet approved or cleared by the [employer] FDA and  has been authorized for detection and/or diagnosis of SARS-CoV-2 by FDA under an Emergency Use Authorization (EUA). This EUA will remain  in effect (meaning this test can be used) for the duration of the COVID-19 declaration under Section 564(b)(1) of the Act, 21 U.S.C.section 360bbb-3(b)(1), unless the authorization is terminated  or revoked sooner.       Influenza A by PCR NEGATIVE NEGATIVE Final   Influenza B by PCR NEGATIVE NEGATIVE Final    Comment: (NOTE) The Xpert Xpress SARS-CoV-2/FLU/RSV plus assay is intended as an aid in the diagnosis of influenza from Nasopharyngeal swab specimens and should  not be used as a sole basis for treatment. Nasal washings and aspirates are unacceptable for Xpert Xpress SARS-CoV-2/FLU/RSV testing.  Fact Sheet for Patients: [employer]  Fact Sheet for Healthcare Providers: [employer]ious[employer]ker[employer]it  This test is not yet approved or cleared by the [place] FDA and has been authorized for detection and/or diagnosis of SARS-CoV-2 by FDA under an Emergency Use Authorization (EUA). This EUA will remain in effect (meaning this test can be used) for the duration of the COVID-19 declaration under Section 564(b)(1) of the Act, 21 U.S.C. section 360bbb-3(b)(1), unless the authorization is terminated or revoked.  Performed at [facility], [address] [place], [place] [address]       Radiology Studies: DG Chest 2 View  Result Date: 08/06/2021 CLINICAL DATA:  50 year old male with shortness of breath, pulmonary embolism. EXAM: CHEST - 2 VIEW COMPARISON:  08/05/2021, 08/03/2021 FINDINGS: The  mediastinal contours are partially obscured, unchanged. No cardiomegaly. Similar elevation left hemidiaphragm with left basilar consolidative opacity in obscuration of the left costophrenic angle. Subsegmental atelectasis in the right lung base. No acute osseous abnormality. IMPRESSION: Similar appearing left basilar opacity. Differential continues to include infarction, effusion, or mucous plugging. Repeat chest CT may be more illustratrative as clinically appropriate. Electronically Signed   By: Marliss Coots M.D.   On: 08/06/2021 15:32   CT CHEST WO CONTRAST  Result Date: 08/07/2021 CLINICAL DATA:  Shortness of breath and left-sided chest pain. EXAM: CT CHEST WITHOUT CONTRAST TECHNIQUE: Multidetector CT imaging of the chest was performed following the standard protocol without IV contrast. COMPARISON:  Chest radiograph dated 08/06/2021 and CT chest dated 08/03/2021. FINDINGS: Cardiovascular: No significant vascular findings. Normal heart size. No pericardial effusion. Mediastinum/Nodes: No enlarged mediastinal or axillary lymph nodes. Thyroid gland, trachea, and esophagus demonstrate no significant findings. Lungs/Pleura: There are large left and small right pleural effusions with associated atelectasis, new since 08/03/2021. Moderate ground-glass opacities are seen in both upper lobes with areas of consolidation in the left lower and left upper lobes, new since 08/03/2021. There is no pneumothorax. Upper Abdomen: No acute abnormality. Musculoskeletal: No chest wall mass or suspicious bone lesions identified. IMPRESSION: 1. Large left and small right pleural effusions with associated atelectasis. 2. Moderate ground-glass opacities in both lungs may represent pneumonia. Electronically Signed   By: Romona Curls M.D.   On: 08/07/2021 10:24   US Venous Img Upper Bilat (DVT)  Result Date: 08/07/2021 CLINICAL DATA:  50 year old male with history of pulmonary embolism. EXAM: BILATERAL UPPER EXTREMITY VENOUS  DOPPLER ULTRASOUND TECHNIQUE: Gray-scale sonography with graded compression, as well as color Doppler and duplex ultrasound were performed to evaluate the bilateral upper extremity deep venous systems from the level of the subclavian vein and including the jugular, axillary, basilic, radial, ulnar and upper cephalic vein. Spectral Doppler was utilized to evaluate flow at rest and with distal augmentation maneuvers. COMPARISON:  None. FINDINGS: RIGHT UPPER EXTREMITY Internal Jugular Vein: No evidence of thrombus. Normal compressibility, respiratory phasicity and response to augmentation. Subclavian Vein: No evidence of thrombus. Normal compressibility, respiratory phasicity and response to augmentation. Axillary Vein: No evidence of thrombus. Normal compressibility, respiratory phasicity and response to augmentation. Cephalic Vein: No evidence of thrombus. Normal compressibility, respiratory phasicity and response to augmentation. Basilic Vein: No evidence of thrombus. Normal compressibility, respiratory phasicity and response to augmentation. Brachial Veins: No evidence of thrombus. Normal compressibility, respiratory phasicity and response to augmentation. Radial Veins: No evidence of thrombus. Normal compressibility, respiratory phasicity and response to augmentation. Ulnar Veins: No  evidence of thrombus. Normal compressibility, respiratory phasicity and response to augmentation. Other Findings:  None. LEFT UPPER EXTREMITY Internal Jugular Vein: No evidence of thrombus. Normal compressibility, respiratory phasicity and response to augmentation. Subclavian Vein: No evidence of thrombus. Normal compressibility, respiratory phasicity and response to augmentation. Axillary Vein: No evidence of thrombus. Normal compressibility, respiratory phasicity and response to augmentation. Cephalic Vein: No evidence of thrombus. Normal compressibility, respiratory phasicity and response to augmentation. Basilic Vein: No evidence  of thrombus. Normal compressibility, respiratory phasicity and response to augmentation. Brachial Veins: No evidence of thrombus. Normal compressibility, respiratory phasicity and response to augmentation. Radial Veins: No evidence of thrombus. Normal compressibility, respiratory phasicity and response to augmentation. Ulnar Veins: No evidence of thrombus. Normal compressibility, respiratory phasicity and response to augmentation. Other Findings:  None. IMPRESSION: No evidence of deep vein thrombosis within either upper extremity. Marliss Coots, MD [facility] Radiology Electronically Signed   By: Marliss Coots M.D.   On: 08/07/2021 10:50    Scheduled Meds:  apixaban  10 mg Oral BID   Followed by   Melene Muller ON 08/11/2021] apixaban  5 mg Oral BID   ferrous fumarate-b12-vitamic C-folic acid  1 capsule Oral TID PC   insulin aspart  0-15 Units Subcutaneous TID WC   insulin aspart  0-5 Units Subcutaneous QHS   insulin aspart  8 Units Subcutaneous TID WC   insulin glargine-yfgn  30 Units Subcutaneous BID   ipratropium-albuterol  3 mL Nebulization Q4H   lisinopril  5 mg Oral Daily   sertraline  100 mg Oral Daily   Continuous Infusions:  cefTRIAXone (ROCEPHIN)  IV Stopped (08/07/21 1146)     LOS: 3 days   Time spent: 38 minutes. More than 50% of the time was spent in counseling/coordination of care  Arnetha Courser, MD [facility]  If 7PM-7AM, please contact night-coverage [email or web address]  08/08/2021, 8:31 AM   This record has been created using Conservation officer, historic buildings. Errors have been sought and corrected,but may not always be located. Such creation errors do not reflect on the standard of care.

## 2021-08-08 NOTE — Progress Notes (Signed)
Pulmonary Medicine          Date: 08/08/2021,   MRN# [id number] William Lin [birth year]     AdmissionWeight: 99.8 kg                 CurrentWeight: 99.8 kg   Reffering physician: Dr Reesa Chew    CHIEF COMPLAINT:   Acute hypoxemic respiratory failure   HISTORY OF PRESENT ILLNESS   This is a 50 yo M with hx of DM, MDD, anxiety , pleural effusions, chronic anemia of unclear diagnosis possible genetic came in with complaints of chest discomfort which was severe and with left arm radiation. He denies URI symptoms, sick contacts , NVD, admits to cross country travel to [place] and [place].  He had CT angio done showing Left sided PE's no RH strain. His H/h started to trend down and patient developed pleural effusion. PCCM for further evaluation and management. Hes on 3-4L/min [place] for supplemental O2.  He is eating and going to bathroom ok.  I met with sister [name] of patient and spoke to wife [name] on phone.   08/08/21- patient is slightly improved, he is on 3L/min  and reports dull chest pain over left hemithorax appx 4/10 at worst. He is able to speak in full sentences in non labored breathing pattern. He is scheduled for thoracenteis today at 1pm.    PAST MEDICAL HISTORY   Past Medical History:  Diagnosis Date  . Anxiety   . Diabetes mellitus without complication ([place])   . HTN (hypertension)   . Sickle cell anemia ([facility])      SURGICAL HISTORY   Past Surgical History:  Procedure Laterality Date  . CHOLECYSTECTOMY    . DENTAL SURGERY    . polynodial cyst    . ROTATOR CUFF REPAIR     Left     FAMILY HISTORY   Family History  Problem Relation Age of Onset  . Hypertension Other   . Hypertension Mother   . Arthritis Mother      SOCIAL HISTORY   Social History   Tobacco Use  . Smoking status: Never  . Smokeless tobacco: Never  Substance Use Topics  . Alcohol use: No  . Drug use: Never     MEDICATIONS    Home Medication:    Current  Medication:  Current Facility-Administered Medications:  .  acetaminophen (TYLENOL) tablet 650 mg, 650 mg, Oral, Q6H PRN, Ivor Costa, MD, 650 mg at 08/08/21 0902 .  albuterol (VENTOLIN HFA) 108 (90 Base) MCG/ACT inhaler 2 puff, 2 puff, Inhalation, Q4H PRN, Ivor Costa, MD .  apixaban (ELIQUIS) tablet 10 mg, 10 mg, Oral, BID, 10 mg at 08/08/21 0902 **FOLLOWED BY** [START ON 08/11/2021] apixaban (ELIQUIS) tablet 5 mg, 5 mg, Oral, BID, Rito Ehrlich A, RPH .  cefTRIAXone (ROCEPHIN) 2 g in sodium chloride 0.9 % 100 mL IVPB, 2 g, Intravenous, Q24H, [name], MD, Stopped at 08/07/21 1146 .  dextromethorphan-guaiFENesin (MUCINEX DM) 30-600 MG per 12 hr tablet 1 tablet, 1 tablet, Oral, BID PRN, Ivor Costa, MD, 1 tablet at 08/08/21 0210 .  ferrous [id number] C-folic acid (TRINSICON / FOLTRIN) capsule 1 capsule, 1 capsule, Oral, TID PC, Amin, Soundra Pilon, MD, 1 capsule at 08/08/21 1218 .  hydrALAZINE (APRESOLINE) injection 5 mg, 5 mg, Intravenous, Q2H PRN, Ivor Costa, MD .  HYDROmorphone (DILAUDID) injection 1 mg, 1 mg, Intravenous, Q4H PRN, [name], MD, 1 mg at 08/06/21 1946 .  insulin aspart (novoLOG) injection 0-15 Units, 0-15 Units, Subcutaneous,  TID WC, [name], MD, 8 Units at 08/08/21 1218 .  insulin aspart (novoLOG) injection 0-5 Units, 0-5 Units, Subcutaneous, QHS, Ivor Costa, MD, 4 Units at 08/05/21 2215 .  insulin aspart (novoLOG) injection 8 Units, 8 Units, Subcutaneous, TID WC, [name], MD, 8 Units at 08/08/21 1218 .  insulin glargine-yfgn (SEMGLEE) injection 30 Units, 30 Units, Subcutaneous, BID, [name], MD, 30 Units at 08/08/21 1012 .  ipratropium-albuterol (DUONEB) 0.5-2.5 (3) MG/3ML nebulizer solution 3 mL, 3 mL, Nebulization, Q4H, Amin, Soundra Pilon, MD, 3 mL at 08/08/21 0926 .  lisinopril (ZESTRIL) tablet 10 mg, 10 mg, Oral, Daily, [name], MD, 10 mg at 08/08/21 0902 .  nitroGLYCERIN (NITROSTAT) SL tablet 0.4 mg, 0.4 mg, Sublingual, Q5 min PRN, [name], MD, 0.4 mg at 08/05/21 0852 .  ondansetron (ZOFRAN) injection 4 mg, 4 mg, Intravenous, Q8H PRN, Ivor Costa, MD, 4 mg at 08/05/21 1641 .  oxyCODONE-acetaminophen (PERCOCET/ROXICET) 5-325 MG per tablet 1 tablet, 1 tablet, Oral, Q4H PRN, Ivor Costa, MD, 1 tablet at 08/07/21 2107 .  sertraline (ZOLOFT) tablet 100 mg, 100 mg, Oral, Daily, Ivor Costa, MD, 100 mg at 08/08/21 3300    ALLERGIES   Aspirin and Sulfonamide derivatives     REVIEW OF SYSTEMS    Review of Systems:  Gen:  Denies  fever, sweats, chills weigh loss  HEENT: Denies blurred vision, double vision, ear pain, eye pain, hearing loss, nose bleeds, sore throat Cardiac:  No dizziness, chest pain or heaviness, chest tightness,edema Resp:   Denies cough or sputum porduction, shortness of breath,wheezing, hemoptysis,  Gi: Denies swallowing difficulty, stomach pain, nausea or vomiting, diarrhea, constipation, bowel incontinence Gu:  Denies bladder incontinence, burning urine Ext:   Denies Joint pain, stiffness or swelling Skin: Denies  skin rash, easy bruising or bleeding or hives Endoc:  Denies polyuria, polydipsia , polyphagia or weight change Psych:   Denies depression, insomnia or hallucinations   Other:  All other systems negative   VS: BP 139/78 (BP Location: Right Arm)   Pulse (!) 109   Temp 99.3 F (37.4 C)   Resp 17   Ht _0  (1.778 m)   Wt 99.8 kg   SpO2 99%   BMI 31.57 kg/m      PHYSICAL EXAM    GENERAL:NAD, no fevers, chills, no weakness no fatigue HEAD: Normocephalic, atraumatic.  EYES: Pupils equal, round, reactive to light. Extraocular muscles intact. No scleral icterus.  MOUTH: Moist mucosal membrane. Dentition intact. No abscess noted.  EAR, NOSE, THROAT: Clear without exudates. No external lesions.  NECK: Supple. No thyromegaly. No nodules. No JVD.  PULMONARY: bilateral rhonchi worse at left base  CARDIOVASCULAR: S1 and S2. Regular rate and rhythm. No murmurs, rubs, or gallops. No  edema. Pedal pulses 2+ bilaterally.  GASTROINTESTINAL: Soft, nontender, nondistended. No masses. Positive bowel sounds. No hepatosplenomegaly.  MUSCULOSKELETAL: No swelling, clubbing, or edema. Range of motion full in all extremities.  NEUROLOGIC: Cranial nerves II through XII are intact. No gross focal neurological deficits. Sensation intact. Reflexes intact.  SKIN: No ulceration, lesions, rashes, or cyanosis. Skin warm and dry. Turgor intact.  PSYCHIATRIC: Mood, affect within normal limits. The patient is awake, alert and oriented x 3. Insight, judgment intact.       IMAGING    DG Chest 2 View  Result Date: 08/06/2021 CLINICAL DATA:  50 year old male with shortness of breath, pulmonary embolism. EXAM: CHEST - 2 VIEW COMPARISON:  08/05/2021, 08/03/2021 FINDINGS: The mediastinal contours are partially obscured, unchanged. No  cardiomegaly. Similar elevation left hemidiaphragm with left basilar consolidative opacity in obscuration of the left costophrenic angle. Subsegmental atelectasis in the right lung base. No acute osseous abnormality. IMPRESSION: Similar appearing left basilar opacity. Differential continues to include infarction, effusion, or mucous plugging. Repeat chest CT may be more illustratrative as clinically appropriate. Electronically Signed   By: Ruthann Cancer M.D.   On: 08/06/2021 15:32   CT CHEST WO CONTRAST  Result Date: 08/07/2021 CLINICAL DATA:  Shortness of breath and left-sided chest pain. EXAM: CT CHEST WITHOUT CONTRAST TECHNIQUE: Multidetector CT imaging of the chest was performed following the standard protocol without IV contrast. COMPARISON:  Chest radiograph dated 08/06/2021 and CT chest dated 08/03/2021. FINDINGS: Cardiovascular: No significant vascular findings. Normal heart size. No pericardial effusion. Mediastinum/Nodes: No enlarged mediastinal or axillary lymph nodes. Thyroid gland, trachea, and esophagus demonstrate no significant findings. Lungs/Pleura: There  are large left and small right pleural effusions with associated atelectasis, new since 08/03/2021. Moderate ground-glass opacities are seen in both upper lobes with areas of consolidation in the left lower and left upper lobes, new since 08/03/2021. There is no pneumothorax. Upper Abdomen: No acute abnormality. Musculoskeletal: No chest wall mass or suspicious bone lesions identified. IMPRESSION: 1. Large left and small right pleural effusions with associated atelectasis. 2. Moderate ground-glass opacities in both lungs may represent pneumonia. Electronically Signed   By: Zerita Boers M.D.   On: 08/07/2021 10:24   CT Angio Chest PE W/Cm &/Or Wo Cm  Result Date: 08/03/2021 CLINICAL DATA:  Evaluate for pulmonary embolus EXAM: CT ANGIOGRAPHY CHEST WITH CONTRAST TECHNIQUE: Multidetector CT imaging of the chest was performed using the standard protocol during bolus administration of intravenous contrast. Multiplanar CT image reconstructions and MIPs were obtained to evaluate the vascular anatomy. CONTRAST:  58m OMNIPAQUE IOHEXOL 350 MG/ML SOLN COMPARISON:  Chest CT dated July 25, 2011 FINDINGS: Cardiovascular: Adequate opacification of the pulmonary arteries. Intraluminal filling defect of segmental and subsegmental left lower lobe pulmonary arteries. Normal heart size with no CT evidence of right heart strain. No pericardial effusion. No significant coronary artery calcifications or atherosclerotic disease of the thoracic aorta. Mediastinum/Nodes: Esophagus and thyroid are unremarkable. No pathologically enlarged lymph nodes seen in the chest. Lungs/Pleura: Central airways are patent. Left lower lobe linear opacities, likely due to scarring or atelectasis. No consolidation, pleural effusion or pneumothorax. Upper Abdomen: No acute abnormality. Musculoskeletal: No chest wall abnormality. No acute or significant osseous findings. Review of the MIP images confirms the above findings. IMPRESSION: Pulmonary embolus  of segmental and subsegmental left lower lobe pulmonary arteries. No CT evidence of right heart strain. Critical Value/emergent results were called by telephone at the time of interpretation on 08/03/2021 at 1:58 pm to provider IEllender Hose MD, who verbally acknowledged these results. Electronically Signed   By: LYetta GlassmanM.D.   On: 08/03/2021 13:59   [place]Venous Img Lower Bilateral (DVT)  Result Date: 08/03/2021 CLINICAL DATA:  Pulmonary embolism. EXAM: BILATERAL LOWER EXTREMITY VENOUS DOPPLER ULTRASOUND TECHNIQUE: Gray-scale sonography with graded compression, as well as color Doppler and duplex ultrasound were performed to evaluate the lower extremity deep venous systems from the level of the common femoral vein and including the common femoral, femoral, profunda femoral, popliteal and calf veins including the posterior tibial, peroneal and gastrocnemius veins when visible. The superficial great saphenous vein was also interrogated. Spectral Doppler was utilized to evaluate flow at rest and with distal augmentation maneuvers in the common femoral, femoral and popliteal veins. COMPARISON:  None. FINDINGS: RIGHT  LOWER EXTREMITY Common Femoral Vein: No evidence of thrombus. Normal compressibility, respiratory phasicity and response to augmentation. Saphenofemoral Junction: No evidence of thrombus. Normal compressibility and flow on color Doppler imaging. Profunda Femoral Vein: No evidence of thrombus. Normal compressibility and flow on color Doppler imaging. Femoral Vein: No evidence of thrombus. Normal compressibility, respiratory phasicity and response to augmentation. Popliteal Vein: No evidence of thrombus. Normal compressibility, respiratory phasicity and response to augmentation. Calf Veins: No evidence of thrombus. Normal compressibility and flow on color Doppler imaging. Superficial Great Saphenous Vein: No evidence of thrombus. Normal compressibility. Venous Reflux:  None. Other Findings: No evidence  of superficial thrombophlebitis or abnormal fluid collection. LEFT LOWER EXTREMITY Common Femoral Vein: No evidence of thrombus. Normal compressibility, respiratory phasicity and response to augmentation. Saphenofemoral Junction: No evidence of thrombus. Normal compressibility and flow on color Doppler imaging. Profunda Femoral Vein: No evidence of thrombus. Normal compressibility and flow on color Doppler imaging. Femoral Vein: No evidence of thrombus. Normal compressibility, respiratory phasicity and response to augmentation. Popliteal Vein: No evidence of thrombus. Normal compressibility, respiratory phasicity and response to augmentation. Calf Veins: No evidence of thrombus. Normal compressibility and flow on color Doppler imaging. Superficial Great Saphenous Vein: No evidence of thrombus. Normal compressibility. Venous Reflux:  None. Other Findings: There is thrombus noted in the left short saphenous vein behind the knee and extending into the calf. This thrombus does not visibly extend into the left popliteal vein. IMPRESSION: No evidence of deep venous thrombosis in either lower extremity. Superficial thrombophlebitis noted in the left short saphenous vein. Electronically Signed   By: Aletta Edouard M.D.   On: 08/03/2021 16:59   US Venous Img Upper Bilat (DVT)  Result Date: 08/07/2021 CLINICAL DATA:  50 year old male with history of pulmonary embolism. EXAM: BILATERAL UPPER EXTREMITY VENOUS DOPPLER ULTRASOUND TECHNIQUE: Gray-scale sonography with graded compression, as well as color Doppler and duplex ultrasound were performed to evaluate the bilateral upper extremity deep venous systems from the level of the subclavian vein and including the jugular, axillary, basilic, radial, ulnar and upper cephalic vein. Spectral Doppler was utilized to evaluate flow at rest and with distal augmentation maneuvers. COMPARISON:  None. FINDINGS: RIGHT UPPER EXTREMITY Internal Jugular Vein: No evidence of thrombus.  Normal compressibility, respiratory phasicity and response to augmentation. Subclavian Vein: No evidence of thrombus. Normal compressibility, respiratory phasicity and response to augmentation. Axillary Vein: No evidence of thrombus. Normal compressibility, respiratory phasicity and response to augmentation. Cephalic Vein: No evidence of thrombus. Normal compressibility, respiratory phasicity and response to augmentation. Basilic Vein: No evidence of thrombus. Normal compressibility, respiratory phasicity and response to augmentation. Brachial Veins: No evidence of thrombus. Normal compressibility, respiratory phasicity and response to augmentation. Radial Veins: No evidence of thrombus. Normal compressibility, respiratory phasicity and response to augmentation. Ulnar Veins: No evidence of thrombus. Normal compressibility, respiratory phasicity and response to augmentation. Other Findings:  None. LEFT UPPER EXTREMITY Internal Jugular Vein: No evidence of thrombus. Normal compressibility, respiratory phasicity and response to augmentation. Subclavian Vein: No evidence of thrombus. Normal compressibility, respiratory phasicity and response to augmentation. Axillary Vein: No evidence of thrombus. Normal compressibility, respiratory phasicity and response to augmentation. Cephalic Vein: No evidence of thrombus. Normal compressibility, respiratory phasicity and response to augmentation. Basilic Vein: No evidence of thrombus. Normal compressibility, respiratory phasicity and response to augmentation. Brachial Veins: No evidence of thrombus. Normal compressibility, respiratory phasicity and response to augmentation. Radial Veins: No evidence of thrombus. Normal compressibility, respiratory phasicity and response to augmentation. Ulnar Veins: No evidence  of thrombus. Normal compressibility, respiratory phasicity and response to augmentation. Other Findings:  None. IMPRESSION: No evidence of deep vein thrombosis within  either upper extremity. Ruthann Cancer, MD [facility] Electronically Signed   By: Ruthann Cancer M.D.   On: 08/07/2021 10:50   DG Chest Port 1 View  Result Date: 08/05/2021 CLINICAL DATA:  50 year old male shortness of breath. Chest pain and diaphoresis. Positive left pulmonary embolus on recent CTA. EXAM: PORTABLE CHEST 1 VIEW COMPARISON:  CTA chest 08/03/2021 and earlier. FINDINGS: Portable AP upright view at 0905 hours. Lower lung volumes with increasing dense opacity at the left lung base most resembling pleural effusion. No pulmonary edema or pneumothorax. Visible mediastinal contours remain stable. Patchy increased right lung opacity more resembling atelectasis. No acute osseous abnormality identified. Negative visible bowel gas. IMPRESSION: Lower lung volumes with increasing dense opacification at the left lung base. Favor acute pleural effusion, although pulmonary infarct not excluded in this setting. Increased right lung base atelectasis. Electronically Signed   By: Genevie Ann M.D.   On: 08/05/2021 09:37      ASSESSMENT/PLAN   Acute on chronic hypoxemic respiratory failure  -S/p PE and with pleural effusion    -have discused with patient and family   - patient is on eliquis    Will check fluid studies including culture to evaluate for hemothorax vs empyema  - patient scheduled for thoracentesis today    Atelectasis of left lung  - Bronchopulmonary hygiene    Left lung bronchopneumonia - on zithromax and rocephin     Thank you for allowing me to participate in the care of this patient.   Total face to face encounter time for this patient visit was >45 min. >50% of the time was  spent in counseling and coordination of care.    Patient/Family are satisfied with care plan and all questions have been answered.   This document was prepared using Dragon voice recognition software and may include unintentional dictation  errors.     Ottie Glazier, M.D.  Division of [place]

## 2021-08-08 NOTE — Progress Notes (Signed)
[facility] Communication Note  Consult received from Dr. Karna Christmas in regard to pulmonary embolism and possible endovascular intervention. Patient's family in his room however the patient was downstairs undergoing a thoracentesis. Had a long discussion with the patient's sisters who provided a detailed history. Recommend reassessing the patient and his respiratory status after undergoing a thoracentesis.  If the patient continues to experience chest discomfort and shortness of breath we will certainly consider moving forward with pulmonary thrombectomy/thrombolysis at that time. Currently, the patient does have a smaller clot burden in the absence of right heart strain. I will plan on returning tomorrow a.m. to reassess.  I will make the patient n.p.o. after midnight in preparation. The patient's family members are satisfied with the plan.  Discussed with Dr. Charlie Pitter Shana Zavaleta PA-C 08/08/2021 3:44 PM

## 2021-08-08 NOTE — Progress Notes (Signed)
Inpatient Diabetes Program Recommendations  AACE/ADA: New Consensus Statement on Inpatient Glycemic Control   Target Ranges:  Prepandial:   less than 140 mg/dL      Peak postprandial:   less than 180 mg/dL (1-2 hours)      Critically ill patients:  140 - 180 mg/dL   Results for William Lin, William Lin (MRN [id number]) as of 08/08/2021 13:54  Ref. Range 08/07/2021 08:14 08/07/2021 11:28 08/07/2021 16:35 08/07/2021 21:13 08/08/2021 08:00 08/08/2021 10:57  Glucose-Capillary Latest Ref Range: 70 - 99 mg/dL 841 (H) 660 (H) 630 (H) 189 (H) 250 (H) 278 (H)   Results for William Lin, William Lin (MRN [id number]) as of 08/08/2021 13:54  Ref. Range 08/03/2021 12:19  Hemoglobin A1C Latest Ref Range: 4.8 - 5.6 % 6.8 (H)   Review of Glycemic Control  Diabetes history: DM2 Outpatient Diabetes medications: Actos 15 mg daily, Metformin XR 1000 mg BID Current orders for Inpatient glycemic control: Semglee 30 units BID, Novolog 0-15 units TID with meals, Novolog 0-5 units QHS, Novolog 8 units TID with meals  Inpatient Diabetes Program Recommendations:    Insulin: Noted Semglee increased from 25 BID to 30 units BID today. If glucose remains consisently over 180 mg/dl with increase, may want to consider increasing meal coverage to Novolog 12 units TID with meals.  NOTE: As an inpatient, patient is requiring a significant amount of insulin and glucose has consistently been in the 200's mg/dl.  Spoke with patient and wife at bedside and he confirms that he has been consistently taking Actos and Metformin as an outpatient and he states that his glucose usually trends okay when he eats like he should. Patient states that he has recently been on vacation in [place] and also went to [place], he reports that he did not follow carb modified diet very well while away from home. Patient states that his glucose is usually higher when he does not stick to a carb modified diet. Patient reports he does not check glucose consistently, mostly when  he starts getting symptoms of hyperglycemia. Patient states that he has never been on any other DM medications except Metformin and Actos which he has been on for years. Discussed insulin needs and glucose trends since being admitted. Explained that he is being given a lot of insulin since being admitted for glucose control. Inquired about possibly being discharged on insulin if provider felt he needed it and patient states that he would prefer to stay on oral DM medications as an outpatient and follow up with his provider. Patient states that he plans to do better with his diet as well. Patient's wife also states that she thought he just had prediabetes (was not aware he had official DM dx) so she plans to make changes with their meals so that it helps with DM control. Encouraged patient to make follow up visit with PCP shortly after discharge to follow up on glycemic control. Discussed glucose and A1C goals. Encouraed patient to check glucose at least 1-2 times per day. Discussed potential complications from uncontrolled DM and stressed importance of DM control to prevent any further complications from uncontrolled DM. Patient verbalized understanding of information and states he has no questions at this time.   Thanks, Orlando Penner, RN, MSN, CDE Diabetes Coordinator Inpatient Diabetes Program [phone] (Team Pager from 8am to 5pm)

## 2021-08-08 NOTE — Progress Notes (Signed)
OT Cancellation Note  Patient Details Name: William Lin MRN: [id number] DOB: [birth year]   Cancelled Treatment:    Reason Eval/Treat Not Completed: Patient declined, no reason specified. Upon attempt, pt and family present, pt still waiting to be taken for thoracentesis. Pt politely declining therapy prior to procedure. Agreeable to re-attempt after procedure. Spoke with RN. He should be going within the next or so for the procedure.   Arman Filter., MPH, MS, OTR/L ascom [phone] 08/08/21, 2:10 PM

## 2021-08-09 ENCOUNTER — Encounter
Admission: EM | Disposition: A | Discharge status: Home / Self Care | Payer: Self-pay | Source: Home / Self Care | Attending: Internal Medicine

## 2021-08-09 ENCOUNTER — Other Ambulatory Visit (INDEPENDENT_AMBULATORY_CARE_PROVIDER_SITE_OTHER): Payer: Self-pay | Admitting: Vascular Surgery

## 2021-08-09 DIAGNOSIS — I2699 Other pulmonary embolism without acute cor pulmonale: Secondary | ICD-10-CM | POA: Diagnosis not present

## 2021-08-09 DIAGNOSIS — E119 Type 2 diabetes mellitus without complications: Secondary | ICD-10-CM | POA: Diagnosis not present

## 2021-08-09 DIAGNOSIS — R071 Chest pain on breathing: Secondary | ICD-10-CM | POA: Diagnosis not present

## 2021-08-09 DIAGNOSIS — I2782 Chronic pulmonary embolism: Secondary | ICD-10-CM

## 2021-08-09 DIAGNOSIS — F418 Other specified anxiety disorders: Secondary | ICD-10-CM | POA: Diagnosis not present

## 2021-08-09 HISTORY — PX: PULMONARY THROMBECTOMY: CATH118295

## 2021-08-09 LAB — HGB FRAC BY HPLC+SOLUBILITY
Hgb A: 0 % — ABNORMAL LOW (ref 96.4–98.8)
Hgb C: 0 %
Hgb E: 0 %
Hgb F: 30.9 % — ABNORMAL HIGH (ref 0.0–2.0)
Hgb S: 66.9 % — ABNORMAL HIGH
Hgb Variant: 0 %

## 2021-08-09 LAB — CBC
HCT: 25.2 % — ABNORMAL LOW (ref 39.0–52.0)
Hemoglobin: 9 g/dL — ABNORMAL LOW (ref 13.0–17.0)
MCH: 24.3 pg — ABNORMAL LOW (ref 26.0–34.0)
MCHC: 35.7 g/dL (ref 30.0–36.0)
MCV: 67.9 fL — ABNORMAL LOW (ref 80.0–100.0)
Platelets: 203 10*3/uL (ref 150–400)
RBC: 3.71 MIL/uL — ABNORMAL LOW (ref 4.22–5.81)
RDW: 16.3 % — ABNORMAL HIGH (ref 11.5–15.5)
WBC: 12.1 10*3/uL — ABNORMAL HIGH (ref 4.0–10.5)
nRBC: 0 % (ref 0.0–0.2)

## 2021-08-09 LAB — GLUCOSE, CAPILLARY
Glucose-Capillary: 155 mg/dL — ABNORMAL HIGH (ref 70–99)
Glucose-Capillary: 131 mg/dL — ABNORMAL HIGH (ref 70–99)
Glucose-Capillary: 68 mg/dL — ABNORMAL LOW (ref 70–99)
Glucose-Capillary: 144 mg/dL — ABNORMAL HIGH (ref 70–99)
Glucose-Capillary: 103 mg/dL — ABNORMAL HIGH (ref 70–99)
Glucose-Capillary: 226 mg/dL — ABNORMAL HIGH (ref 70–99)

## 2021-08-09 LAB — BASIC METABOLIC PANEL
Anion gap: 10 (ref 5–15)
BUN: 9 mg/dL (ref 6–20)
CO2: 29 mmol/L (ref 22–32)
Calcium: 9.1 mg/dL (ref 8.9–10.3)
Chloride: 100 mmol/L (ref 98–111)
Creatinine, Ser: 0.75 mg/dL (ref 0.61–1.24)
GFR, Estimated: >60 mL/min (ref >60)
Glucose, Bld: 181 mg/dL — ABNORMAL HIGH (ref 70–99)
Potassium: 2.8 mmol/L — ABNORMAL LOW (ref 3.5–5.1)
Sodium: 139 mmol/L (ref 135–145)

## 2021-08-09 LAB — HGB FRACTIONATION CASCADE: Hgb A2: 2.2 % (ref 1.8–3.2)

## 2021-08-09 LAB — MAGNESIUM: Magnesium: 1.7 mg/dL (ref 1.7–2.4)

## 2021-08-09 SURGERY — PULMONARY THROMBECTOMY
Anesthesia: Moderate Sedation | Laterality: Bilateral

## 2021-08-09 MED ORDER — IPRATROPIUM-ALBUTEROL 0.5-2.5 (3) MG/3ML IN SOLN
3.0000 mL | Freq: Four times a day (QID) | RESPIRATORY_TRACT | Status: DC
  Administered 2021-08-09 – 2021-08-12 (×12): 3 mL via RESPIRATORY_TRACT
  Filled 2021-08-09 (×12): qty 3

## 2021-08-09 MED ORDER — POTASSIUM CHLORIDE CRYS ER 20 MEQ PO TBCR
40.0000 meq | EXTENDED_RELEASE_TABLET | ORAL | Status: AC
Start: 2021-08-09 — End: 2021-08-09
  Administered 2021-08-09 (×2): 40 meq via ORAL
  Filled 2021-08-09: qty 4
  Filled 2021-08-09: qty 2

## 2021-08-09 MED ORDER — MIDAZOLAM HCL 2 MG/ML PO SYRP: 8.0000 mg | ORAL_SOLUTION | Freq: Once | ORAL | Status: DC | PRN

## 2021-08-09 MED ORDER — METHYLPREDNISOLONE SODIUM SUCC 125 MG IJ SOLR: 125.0000 mg | Freq: Once | INTRAMUSCULAR | Status: DC | PRN

## 2021-08-09 MED ORDER — DEXTROSE 50 % IV SOLN
INTRAVENOUS | Status: AC
  Filled 2021-08-09: qty 50

## 2021-08-09 MED ORDER — DIPHENHYDRAMINE HCL 50 MG/ML IJ SOLN: 50.0000 mg | Freq: Once | INTRAMUSCULAR | Status: DC | PRN

## 2021-08-09 MED ORDER — MIDAZOLAM HCL 2 MG/2ML IJ SOLN
INTRAMUSCULAR | Status: AC
  Filled 2021-08-09: qty 2

## 2021-08-09 MED ORDER — FENTANYL CITRATE (PF) 100 MCG/2ML IJ SOLN
INTRAMUSCULAR | Status: AC
  Filled 2021-08-09: qty 2

## 2021-08-09 MED ORDER — FENTANYL CITRATE (PF) 100 MCG/2ML IJ SOLN
INTRAMUSCULAR | Status: DC | PRN
  Administered 2021-08-09: 25 ug via INTRAVENOUS
  Administered 2021-08-09: 50 ug via INTRAVENOUS

## 2021-08-09 MED ORDER — MIDAZOLAM HCL 2 MG/2ML IJ SOLN
INTRAMUSCULAR | Status: DC | PRN
  Administered 2021-08-09: .5 mg via INTRAVENOUS
  Administered 2021-08-09: 1 mg via INTRAVENOUS

## 2021-08-09 MED ORDER — CEFAZOLIN SODIUM-DEXTROSE 2-4 GM/100ML-% IV SOLN
2.0000 g | Freq: Once | INTRAVENOUS | Status: AC
  Administered 2021-08-09 – 2021-08-10 (×2): 2 g via INTRAVENOUS
  Filled 2021-08-09 (×2): qty 100

## 2021-08-09 MED ORDER — ALTEPLASE 2 MG IJ SOLR
INTRAMUSCULAR | Status: AC
  Filled 2021-08-09: qty 4

## 2021-08-09 MED ORDER — HYDROMORPHONE HCL 1 MG/ML IJ SOLN
1.0000 mg | Freq: Once | INTRAMUSCULAR | Status: DC | PRN
  Filled 2021-08-09 (×2): qty 1

## 2021-08-09 MED ORDER — HEPARIN SODIUM (PORCINE) 1000 UNIT/ML IJ SOLN
INTRAMUSCULAR | Status: AC
  Filled 2021-08-09: qty 1

## 2021-08-09 MED ORDER — ONDANSETRON HCL 4 MG/2ML IJ SOLN: 4.0000 mg | Freq: Four times a day (QID) | INTRAMUSCULAR | Status: DC | PRN

## 2021-08-09 MED ORDER — DEXTROSE 50 % IV SOLN: 50.0000 mL | Freq: Once | INTRAVENOUS | Status: DC

## 2021-08-09 MED ORDER — SODIUM CHLORIDE 0.9 % IV SOLN: INTRAVENOUS | Status: DC

## 2021-08-09 MED ORDER — FAMOTIDINE 20 MG PO TABS: 40.0000 mg | ORAL_TABLET | Freq: Once | ORAL | Status: DC | PRN

## 2021-08-09 SURGICAL SUPPLY — 14 items
CANISTER PENUMBRA ENGINE (MISCELLANEOUS) ×1 IMPLANT
CANNULA 5F STIFF (CANNULA) ×1 IMPLANT
CATH ANGIO 5F PIGTAIL 100CM (CATHETERS) ×1 IMPLANT
CATH INFINITI JR4 5F (CATHETERS) ×1 IMPLANT
CATH LIGHTNING 8 XTORQ 115 (CATHETERS) ×1 IMPLANT
COVER PROBE U/S 5X48 (MISCELLANEOUS) ×1 IMPLANT
DEVICE SAFEGUARD 24CM (GAUZE/BANDAGES/DRESSINGS) ×1 IMPLANT
DEVICE TORQUE .025-.038 (MISCELLANEOUS) ×1 IMPLANT
GLIDEWIRE ANGLED SS 035X260CM (WIRE) ×1 IMPLANT
PACK ANGIOGRAPHY (CUSTOM PROCEDURE TRAY) ×2 IMPLANT
SHEATH 65CM 9F (VASCULAR PRODUCTS) ×1 IMPLANT
SHEATH BRITE TIP 8FRX11 (SHEATH) ×1 IMPLANT
WIRE AMPLATZ SSTIFF .035X260CM (WIRE) ×1 IMPLANT
WIRE GUIDERIGHT .035X150 (WIRE) ×1 IMPLANT

## 2021-08-09 NOTE — Consult Note (Signed)
[facility] Vascular Consult Note  MRN : [id number]  William Lin is a 50 y.o. ([birth year]) male who presents with chief complaint of  Chief Complaint  Patient presents with   abnormal labs   History of Present Illness:  This is a 50 year old with hx of DM, MDD, anxiety , pleural effusions, chronic anemia of unclear diagnosis possible genetic came in with complaints of chest discomfort which was severe and with left arm radiation.   He denies URI symptoms, sick contacts , NVD, admits to cross country travel to [place] and [place].  He had CT angio done showing Left sided PE's no RH strain. His H/h started to trend down and patient developed pleural effusion. PCCM for further evaluation and management. Hes on 3-4L/min [place] for supplemental O2.    Patient was on interventional radiology schedule yesterday for image guided thoracentesis however ultrasound did not reveal any sufficient amount of fluid for intervention.  Vascular surgery was consulted by Dr. Lanney Gins for possible intervention in the setting of pulmonary embolism.  Current Facility-Administered Medications  Medication Dose Route Frequency Provider Last Rate Last Admin   0.9 %  sodium chloride infusion   Intravenous Continuous Lashawn Bromwell A, PA-C 75 mL/hr at 08/08/21 2351 New Bag at 08/08/21 2351   acetaminophen (TYLENOL) tablet 650 mg  650 mg Oral Q6H PRN Ivor Costa, MD   650 mg at 08/08/21 0902   albuterol (VENTOLIN HFA) 108 (90 Base) MCG/ACT inhaler 2 puff  2 puff Inhalation Q4H PRN Ivor Costa, MD       apixaban Arne Cleveland) tablet 10 mg  10 mg Oral BID Rito Ehrlich A, RPH   10 mg at 08/09/21 0830   Followed by   Derrill Memo ON 08/11/2021] apixaban (ELIQUIS) tablet 5 mg  5 mg Oral BID Nazari, Walid A, RPH       cefTRIAXone (ROCEPHIN) 2 g in sodium chloride 0.9 % 100 mL IVPB  2 g Intravenous Q24H [name], MD 200 mL/hr at 08/08/21 1230 2 g at 08/08/21 1230   dextromethorphan-guaiFENesin (MUCINEX  DM) 30-600 MG per 12 hr tablet 1 tablet  1 tablet Oral BID PRN Ivor Costa, MD   1 tablet at 08/08/21 0210   ferrous [id number] C-folic acid (TRINSICON / FOLTRIN) capsule 1 capsule  1 capsule Oral TID Philis Fendt, MD   1 capsule at 08/09/21 0831   hydrALAZINE (APRESOLINE) injection 5 mg  5 mg Intravenous Q2H PRN Ivor Costa, MD       HYDROmorphone (DILAUDID) injection 1 mg  1 mg Intravenous Q4H PRN [name], MD   1 mg at 08/08/21 2207   insulin aspart (novoLOG) injection 0-15 Units  0-15 Units Subcutaneous TID WC [name], MD   3 Units at 08/09/21 0830   insulin aspart (novoLOG) injection 0-5 Units  0-5 Units Subcutaneous QHS Ivor Costa, MD   4 Units at 08/05/21 2215   insulin aspart (novoLOG) injection 8 Units  8 Units Subcutaneous TID WC [name], MD   8 Units at 08/08/21 1711   insulin glargine-yfgn (SEMGLEE) injection 30 Units  30 Units Subcutaneous BID [name], MD   30 Units at 08/08/21 2207   ipratropium-albuterol (DUONEB) 0.5-2.5 (3) MG/3ML nebulizer solution 3 mL  3 mL Nebulization Q6H Amin, Soundra Pilon, MD       lisinopril (ZESTRIL) tablet 10 mg  10 mg Oral Daily [name], MD   10 mg at 08/09/21 0831   nitroGLYCERIN (NITROSTAT) SL tablet 0.4 mg  0.4 mg Sublingual Q5 min PRN [name], MD   0.4 mg at 08/05/21 0852   ondansetron (ZOFRAN) injection 4 mg  4 mg Intravenous Q8H PRN Ivor Costa, MD   4 mg at 08/05/21 1641   oxyCODONE-acetaminophen (PERCOCET/ROXICET) 5-325 MG per tablet 1 tablet  1 tablet Oral Q4H PRN Ivor Costa, MD   1 tablet at 08/09/21 6295   potassium chloride SA (KLOR-CON) CR tablet 40 mEq  40 mEq Oral Q4H [name], MD   40 mEq at 08/09/21 0831   sertraline (ZOLOFT) tablet 100 mg  100 mg Oral Daily Ivor Costa, MD   100 mg at 08/09/21 0830   Past Medical History:  Diagnosis Date   Anxiety    Diabetes mellitus without complication (HCC)    HTN (hypertension)    Sickle cell anemia (Organ)    Past Surgical History:  Procedure  Laterality Date   CHOLECYSTECTOMY     DENTAL SURGERY     polynodial cyst     ROTATOR CUFF REPAIR     Left   Social History Social History   Tobacco Use   Smoking status: Never   Smokeless tobacco: Never  Substance Use Topics   Alcohol use: No   Drug use: Never   Family History Family History  Problem Relation Age of Onset   Hypertension Other    Hypertension Mother    Arthritis Mother   Denies family history of peripheral artery disease, venous disease or renal/clotting disorders.  Allergies  Allergen Reactions   Aspirin    Sulfonamide Derivatives    REVIEW OF SYSTEMS (Negative unless checked)  Constitutional: _0 Weight loss  _1 Fever  _2 Chills Cardiac: _3 Chest pain   _4 Chest pressure   _5 Palpitations   _6 Shortness of breath when laying flat   _7 Shortness of breath at rest   _8 Shortness of breath with exertion. Vascular:  _9 Pain in legs with walking   _10 Pain in legs at rest   _11 Pain in legs when laying flat   _12 Claudication   _13 Pain in feet when walking  _14 Pain in feet at rest  _15 Pain in feet when laying flat   _16 History of DVT   _17 Phlebitis   _18 Swelling in legs   _19 Varicose veins   _20 Non-healing ulcers Pulmonary:   _21 Uses home oxygen   _22 Productive cough   _23 Hemoptysis   _24 Wheeze  _25 COPD   _26 Asthma Neurologic:  _27 Dizziness  _28 Blackouts   _29 Seizures   _30 History of stroke   _31 History of TIA  _32 Aphasia   _33 Temporary blindness   _34 Dysphagia   _35 Weakness or numbness in arms   _36 Weakness or numbness in legs Musculoskeletal:  _37 Arthritis   _38 Joint swelling   _39 Joint pain   _40 Low back pain Hematologic:  _41 Easy bruising  _42 Easy bleeding   _43 Hypercoagulable state   _44 Anemic  _45 Hepatitis Gastrointestinal:  _46 Blood in stool   _47 Vomiting blood  _48 Gastroesophageal reflux/heartburn   _49 Difficulty swallowing. Genitourinary:  _50 Chronic kidney disease   _51 Difficult urination  _52 Frequent urination  _53 Burning with urination   _54 Blood in urine Skin:  _55 Rashes   _56 Ulcers    _57 Wounds Psychological:  _58 History of anxiety   _59  History of major depression.  Physical Examination  Vitals:   08/09/21 0400 08/09/21 0434 08/09/21 0756 08/09/21 0813  BP: (!) 142/71  (!) 145/82   Pulse: (!) 103  98 97  Resp: _60 Temp: 98.5 F (36.9 C)  98.7 F (37.1 C)   TempSrc: Oral     SpO2: 98% 98% 98% 97%  Weight:  Height:       Body mass index is 31.57 kg/m. Gen:  WD/WN, NAD Head: [place]/AT, No temporalis wasting. Prominent temp pulse not noted. Ear/Nose/Throat: Hearing grossly intact, nares w/o erythema or drainage, oropharynx w/o Erythema/Exudate Eyes: Sclera non-icteric, conjunctiva clear Neck: Trachea midline.  No JVD.  Pulmonary: On supplemental oxygen.  Minimally short of breath with talking.  Non-labored. Cardiac: RRR, normal S1, S2. Vascular:  Vessel Right Left  Radial Palpable Palpable  Ulnar Palpable Palpable   Gastrointestinal: soft, non-tender/non-distended. No guarding/reflex.  Musculoskeletal: M/S 5/5 throughout.  Extremities without ischemic changes.  No deformity or atrophy. No edema. Neurologic: Sensation grossly intact in extremities.  Symmetrical.  Speech is fluent. Motor exam as listed above. Psychiatric: Judgment intact, Mood & affect appropriate for pt's clinical situation. Dermatologic: No rashes or ulcers noted.  No cellulitis or open wounds. Lymph : No Cervical, Axillary, or Inguinal lymphadenopathy.  CBC Lab Results  Component Value Date   WBC 12.1 (H) 08/09/2021   HGB 9.0 (L) 08/09/2021   HCT 25.2 (L) 08/09/2021   MCV 67.9 (L) 08/09/2021   PLT 203 08/09/2021   BMET    Component Value Date/Time   NA 139 08/09/2021 0447   K 2.8 (L) 08/09/2021 0447   CL 100 08/09/2021 0447   CO2 29 08/09/2021 0447   GLUCOSE 181 (H) 08/09/2021 0447   BUN 9 08/09/2021 0447   CREATININE 0.75 08/09/2021 0447   CALCIUM 9.1 08/09/2021 0447   GFRNONAA >60 08/09/2021 0447   GFRAA  10/16/2009 2228    >60        The eGFR has been  calculated using the MDRD equation. This calculation has not been validated in all clinical situations. eGFR's persistently <60 mL/min signify possible Chronic Kidney Disease.   Estimated Creatinine Clearance: 127.9 mL/min (by C-G formula based on SCr of 0.75 mg/dL).  COAG No results found for: INR, PROTIME  Radiology DG Chest 2 View  Result Date: 08/06/2021 CLINICAL DATA:  50 year old male with shortness of breath, pulmonary embolism. EXAM: CHEST - 2 VIEW COMPARISON:  08/05/2021, 08/03/2021 FINDINGS: The mediastinal contours are partially obscured, unchanged. No cardiomegaly. Similar elevation left hemidiaphragm with left basilar consolidative opacity in obscuration of the left costophrenic angle. Subsegmental atelectasis in the right lung base. No acute osseous abnormality. IMPRESSION: Similar appearing left basilar opacity. Differential continues to include infarction, effusion, or mucous plugging. Repeat chest CT may be more illustratrative as clinically appropriate. Electronically Signed   By: Ruthann Cancer M.D.   On: 08/06/2021 15:32   CT CHEST WO CONTRAST  Result Date: 08/07/2021 CLINICAL DATA:  Shortness of breath and left-sided chest pain. EXAM: CT CHEST WITHOUT CONTRAST TECHNIQUE: Multidetector CT imaging of the chest was performed following the standard protocol without IV contrast. COMPARISON:  Chest radiograph dated 08/06/2021 and CT chest dated 08/03/2021. FINDINGS: Cardiovascular: No significant vascular findings. Normal heart size. No pericardial effusion. Mediastinum/Nodes: No enlarged mediastinal or axillary lymph nodes. Thyroid gland, trachea, and esophagus demonstrate no significant findings. Lungs/Pleura: There are large left and small right pleural effusions with associated atelectasis, new since 08/03/2021. Moderate ground-glass opacities are seen in both upper lobes with areas of consolidation in the left lower and left upper lobes, new since 08/03/2021. There is no  pneumothorax. Upper Abdomen: No acute abnormality. Musculoskeletal: No chest wall mass or suspicious bone lesions identified. IMPRESSION: 1. Large left and small right pleural effusions with associated atelectasis. 2. Moderate ground-glass opacities in both lungs may represent pneumonia. Electronically Signed   By:  Zerita Boers M.D.   On: 08/07/2021 10:24   CT Angio Chest PE W/Cm &/Or Wo Cm  Result Date: 08/03/2021 CLINICAL DATA:  Evaluate for pulmonary embolus EXAM: CT ANGIOGRAPHY CHEST WITH CONTRAST TECHNIQUE: Multidetector CT imaging of the chest was performed using the standard protocol during bolus administration of intravenous contrast. Multiplanar CT image reconstructions and MIPs were obtained to evaluate the vascular anatomy. CONTRAST:  62m OMNIPAQUE IOHEXOL 350 MG/ML SOLN COMPARISON:  Chest CT dated July 25, 2011 FINDINGS: Cardiovascular: Adequate opacification of the pulmonary arteries. Intraluminal filling defect of segmental and subsegmental left lower lobe pulmonary arteries. Normal heart size with no CT evidence of right heart strain. No pericardial effusion. No significant coronary artery calcifications or atherosclerotic disease of the thoracic aorta. Mediastinum/Nodes: Esophagus and thyroid are unremarkable. No pathologically enlarged lymph nodes seen in the chest. Lungs/Pleura: Central airways are patent. Left lower lobe linear opacities, likely due to scarring or atelectasis. No consolidation, pleural effusion or pneumothorax. Upper Abdomen: No acute abnormality. Musculoskeletal: No chest wall abnormality. No acute or significant osseous findings. Review of the MIP images confirms the above findings. IMPRESSION: Pulmonary embolus of segmental and subsegmental left lower lobe pulmonary arteries. No CT evidence of right heart strain. Critical Value/emergent results were called by telephone at the time of interpretation on 08/03/2021 at 1:58 pm to provider IEllender Hose MD, who verbally  acknowledged these results. Electronically Signed   By: LYetta GlassmanM.D.   On: 08/03/2021 13:59   [place] (PLEURAL EFFUSION)  Result Date: 08/09/2021 CLINICAL DATA:  Evaluate pleural fluid for thoracentesis EXAM: CHEST ULTRASOUND COMPARISON:  CT chest October 23 FINDINGS: Focused sonographic exam of the bilateral chest was performed. Submitted images demonstrate trace pleural fluid bilaterally. There is insufficient fluid to perform safe image guided thoracentesis. IMPRESSION: Focused sonographic exam of the bilateral chest demonstrates trace bilateral pleural effusions. Fluid volume is insufficient for safe image guided thoracentesis. When this study is used in conjunction with recent CT chest, it is felt like findings in the chest likely represent more consolidated lung, and less pleural fluid. Electronically Signed   By: YAlbin FellingM.D.   On: 08/09/2021 07:44   [place]ous Img Lower Bilateral (DVT)  Result Date: 08/03/2021 CLINICAL DATA:  Pulmonary embolism. EXAM: BILATERAL LOWER EXTREMITY VENOUS DOPPLER ULTRASOUND TECHNIQUE: Gray-scale sonography with graded compression, as well as color Doppler and duplex ultrasound were performed to evaluate the lower extremity deep venous systems from the level of the common femoral vein and including the common femoral, femoral, profunda femoral, popliteal and calf veins including the posterior tibial, peroneal and gastrocnemius veins when visible. The superficial great saphenous vein was also interrogated. Spectral Doppler was utilized to evaluate flow at rest and with distal augmentation maneuvers in the common femoral, femoral and popliteal veins. COMPARISON:  None. FINDINGS: RIGHT LOWER EXTREMITY Common Femoral Vein: No evidence of thrombus. Normal compressibility, respiratory phasicity and response to augmentation. Saphenofemoral Junction: No evidence of thrombus. Normal compressibility and flow on color Doppler imaging. Profunda Femoral Vein: No  evidence of thrombus. Normal compressibility and flow on color Doppler imaging. Femoral Vein: No evidence of thrombus. Normal compressibility, respiratory phasicity and response to augmentation. Popliteal Vein: No evidence of thrombus. Normal compressibility, respiratory phasicity and response to augmentation. Calf Veins: No evidence of thrombus. Normal compressibility and flow on color Doppler imaging. Superficial Great Saphenous Vein: No evidence of thrombus. Normal compressibility. Venous Reflux:  None. Other Findings: No evidence of superficial thrombophlebitis or abnormal fluid collection. LEFT LOWER EXTREMITY  Common Femoral Vein: No evidence of thrombus. Normal compressibility, respiratory phasicity and response to augmentation. Saphenofemoral Junction: No evidence of thrombus. Normal compressibility and flow on color Doppler imaging. Profunda Femoral Vein: No evidence of thrombus. Normal compressibility and flow on color Doppler imaging. Femoral Vein: No evidence of thrombus. Normal compressibility, respiratory phasicity and response to augmentation. Popliteal Vein: No evidence of thrombus. Normal compressibility, respiratory phasicity and response to augmentation. Calf Veins: No evidence of thrombus. Normal compressibility and flow on color Doppler imaging. Superficial Great Saphenous Vein: No evidence of thrombus. Normal compressibility. Venous Reflux:  None. Other Findings: There is thrombus noted in the left short saphenous vein behind the knee and extending into the calf. This thrombus does not visibly extend into the left popliteal vein. IMPRESSION: No evidence of deep venous thrombosis in either lower extremity. Superficial thrombophlebitis noted in the left short saphenous vein. Electronically Signed   By: Aletta Edouard M.D.   On: 08/03/2021 16:59   US Venous Img Upper Bilat (DVT)  Result Date: 08/07/2021 CLINICAL DATA:  50 year old male with history of pulmonary embolism. EXAM: BILATERAL UPPER  EXTREMITY VENOUS DOPPLER ULTRASOUND TECHNIQUE: Gray-scale sonography with graded compression, as well as color Doppler and duplex ultrasound were performed to evaluate the bilateral upper extremity deep venous systems from the level of the subclavian vein and including the jugular, axillary, basilic, radial, ulnar and upper cephalic vein. Spectral Doppler was utilized to evaluate flow at rest and with distal augmentation maneuvers. COMPARISON:  None. FINDINGS: RIGHT UPPER EXTREMITY Internal Jugular Vein: No evidence of thrombus. Normal compressibility, respiratory phasicity and response to augmentation. Subclavian Vein: No evidence of thrombus. Normal compressibility, respiratory phasicity and response to augmentation. Axillary Vein: No evidence of thrombus. Normal compressibility, respiratory phasicity and response to augmentation. Cephalic Vein: No evidence of thrombus. Normal compressibility, respiratory phasicity and response to augmentation. Basilic Vein: No evidence of thrombus. Normal compressibility, respiratory phasicity and response to augmentation. Brachial Veins: No evidence of thrombus. Normal compressibility, respiratory phasicity and response to augmentation. Radial Veins: No evidence of thrombus. Normal compressibility, respiratory phasicity and response to augmentation. Ulnar Veins: No evidence of thrombus. Normal compressibility, respiratory phasicity and response to augmentation. Other Findings:  None. LEFT UPPER EXTREMITY Internal Jugular Vein: No evidence of thrombus. Normal compressibility, respiratory phasicity and response to augmentation. Subclavian Vein: No evidence of thrombus. Normal compressibility, respiratory phasicity and response to augmentation. Axillary Vein: No evidence of thrombus. Normal compressibility, respiratory phasicity and response to augmentation. Cephalic Vein: No evidence of thrombus. Normal compressibility, respiratory phasicity and response to augmentation. Basilic  Vein: No evidence of thrombus. Normal compressibility, respiratory phasicity and response to augmentation. Brachial Veins: No evidence of thrombus. Normal compressibility, respiratory phasicity and response to augmentation. Radial Veins: No evidence of thrombus. Normal compressibility, respiratory phasicity and response to augmentation. Ulnar Veins: No evidence of thrombus. Normal compressibility, respiratory phasicity and response to augmentation. Other Findings:  None. IMPRESSION: No evidence of deep vein thrombosis within either upper extremity. Ruthann Cancer, MD [facility] Radiology Electronically Signed   By: Ruthann Cancer M.D.   On: 08/07/2021 10:50   DG Chest Port 1 View  Result Date: 08/05/2021 CLINICAL DATA:  50 year old male shortness of breath. Chest pain and diaphoresis. Positive left pulmonary embolus on recent CTA. EXAM: PORTABLE CHEST 1 VIEW COMPARISON:  CTA chest 08/03/2021 and earlier. FINDINGS: Portable AP upright view at 0905 hours. Lower lung volumes with increasing dense opacity at the left lung base most resembling pleural effusion. No  pulmonary edema or pneumothorax. Visible mediastinal contours remain stable. Patchy increased right lung opacity more resembling atelectasis. No acute osseous abnormality identified. Negative visible bowel gas. IMPRESSION: Lower lung volumes with increasing dense opacification at the left lung base. Favor acute pleural effusion, although pulmonary infarct not excluded in this setting. Increased right lung base atelectasis. Electronically Signed   By: Genevie Ann M.D.   On: 08/05/2021 09:37    Assessment/Plan This is a 50 year old with hx of DM, MDD, anxiety , pleural effusions, chronic anemia of unclear diagnosis possible genetic came in with complaints of chest discomfort which was severe and with left arm radiation found to have pulmonary embolism.  1.  Pulmonary Embolism: Patient presented to the [facility] emergency department with progressively worsening chest discomfort and shortness of breath.  Found to have pulmonary embolism on CTA of the chest.  Patient was initiated on heparin now on Eliquis.  Patient still with significant shortness of breath.  Patient was on interventional radiology schedule yesterday for a pleural thoracentesis however the fluid collection was not large enough to be drained.  In the setting of continued significant shortness of breath, need for supplemental oxygen as well as failure for symptoms improve recommend the patient undergo a pulmonary thrombectomy with possible thrombolysis and attempt to lessen the clot burden and improve his symptoms.  Procedure, risks and benefits were explained to the patient.  All questions were answered.  The patient wished to proceed.  We will plan on this today with Dr. Delana Meyer.  2.  Rule out DVT: No DVT in the upper or lower extremity.  3.  Diabetes: On appropriate medications Encouraged good control as its slows the progression of atherosclerotic disease   Discussed with Dr. Francene Castle, PA-C 08/09/2021 10:08 AM  This note was created with Dragon medical transcription system.  Any error is purely unintentional.

## 2021-08-09 NOTE — Progress Notes (Signed)
Pulmonary Medicine          Date: 08/09/2021,   MRN# [id number] William Lin 1971-05-17     AdmissionWeight: 99.8 kg                 CurrentWeight: 99.8 kg   Reffering physician: Dr Reesa Chew    CHIEF COMPLAINT:   Acute hypoxemic respiratory failure   HISTORY OF PRESENT ILLNESS   This is a 50 yo M with hx of DM, MDD, anxiety , pleural effusions, chronic anemia of unclear diagnosis possible genetic came in with complaints of chest discomfort which was severe and with left arm radiation. He denies URI symptoms, sick contacts , NVD, admits to cross country travel to [place] and [place].  He had CT angio done showing Left sided PE's no RH strain. His H/h started to trend down and patient developed pleural effusion. PCCM for further evaluation and management. Hes on 3-4L/min [place] for supplemental O2.  He is eating and going to bathroom ok.  I met with sister [name] of patient and spoke to wife [name] on phone.   08/08/21- patient is slightly improved, he is on 3L/min [place] and reports dull chest pain over left hemithorax appx 4/10 at worst. He is able to speak in full sentences in non labored breathing pattern. He is scheduled for thoracenteis today at 1pm.   08/09/21- patient remains on 3L/min [place]. Wife at bedside. They have discussed with vascular team and additional intervention is planned.    PAST MEDICAL HISTORY   Past Medical History:  Diagnosis Date  . Anxiety   . Diabetes mellitus without complication ([name])   . HTN (hypertension)   . Sickle cell anemia ([facility])      SURGICAL HISTORY   Past Surgical History:  Procedure Laterality Date  . CHOLECYSTECTOMY    . DENTAL SURGERY    . polynodial cyst    . ROTATOR CUFF REPAIR     Left     FAMILY HISTORY   Family History  Problem Relation Age of Onset  . Hypertension Other   . Hypertension Mother   . Arthritis Mother      SOCIAL HISTORY   Social History   Tobacco Use  . Smoking status: Never  . Smokeless  tobacco: Never  Substance Use Topics  . Alcohol use: No  . Drug use: Never     MEDICATIONS    Home Medication:    Current Medication:  Current Facility-Administered Medications:  .  0.9 %  sodium chloride infusion, , Intravenous, Continuous, Stegmayer, Kimberly A, PA-C, Last Rate: 75 mL/hr at 08/08/21 2351, New Bag at 08/08/21 2351 .  acetaminophen (TYLENOL) tablet 650 mg, 650 mg, Oral, Q6H PRN, Ivor Costa, MD, 650 mg at 08/08/21 0902 .  albuterol (VENTOLIN HFA) 108 (90 Base) MCG/ACT inhaler 2 puff, 2 puff, Inhalation, Q4H PRN, Ivor Costa, MD .  apixaban (ELIQUIS) tablet 10 mg, 10 mg, Oral, BID, 10 mg at 08/08/21 2206 **FOLLOWED BY** [START ON 08/11/2021] apixaban (ELIQUIS) tablet 5 mg, 5 mg, Oral, BID, Nazari, Walid A, RPH .  cefTRIAXone (ROCEPHIN) 2 g in sodium chloride 0.9 % 100 mL IVPB, 2 g, Intravenous, Q24H, Amin, Sumayya, MD, Last Rate: 200 mL/hr at 08/08/21 1230, 2 g at 08/08/21 1230 .  dextromethorphan-guaiFENesin (MUCINEX DM) 30-600 MG per 12 hr tablet 1 tablet, 1 tablet, Oral, BID PRN, Ivor Costa, MD, 1 tablet at 08/08/21 0210 .  ferrous [id number] C-folic acid (TRINSICON / FOLTRIN) capsule 1 capsule, 1 capsule,  Oral, TID PC, [name], MD, 1 capsule at 08/08/21 1710 .  hydrALAZINE (APRESOLINE) injection 5 mg, 5 mg, Intravenous, Q2H PRN, Ivor Costa, MD .  HYDROmorphone (DILAUDID) injection 1 mg, 1 mg, Intravenous, Q4H PRN, [name], MD, 1 mg at 08/08/21 2207 .  insulin aspart (novoLOG) injection 0-15 Units, 0-15 Units, Subcutaneous, TID WC, [name], MD, 8 Units at 08/08/21 1710 .  insulin aspart (novoLOG) injection 0-5 Units, 0-5 Units, Subcutaneous, QHS, Ivor Costa, MD, 4 Units at 08/05/21 2215 .  insulin aspart (novoLOG) injection 8 Units, 8 Units, Subcutaneous, TID WC, [name], MD, 8 Units at 08/08/21 1711 .  insulin glargine-yfgn (SEMGLEE) injection 30 Units, 30 Units, Subcutaneous, BID, [name], MD, 30 Units at 08/08/21 2207 .   ipratropium-albuterol (DUONEB) 0.5-2.5 (3) MG/3ML nebulizer solution 3 mL, 3 mL, Nebulization, Q4H, Amin, Sumayya, MD, 3 mL at 08/09/21 0434 .  lisinopril (ZESTRIL) tablet 10 mg, 10 mg, Oral, Daily, [name], MD, 10 mg at 08/08/21 0902 .  nitroGLYCERIN (NITROSTAT) SL tablet 0.4 mg, 0.4 mg, Sublingual, Q5 min PRN, [name], MD, 0.4 mg at 08/05/21 0852 .  ondansetron (ZOFRAN) injection 4 mg, 4 mg, Intravenous, Q8H PRN, Ivor Costa, MD, 4 mg at 08/05/21 1641 .  oxyCODONE-acetaminophen (PERCOCET/ROXICET) 5-325 MG per tablet 1 tablet, 1 tablet, Oral, Q4H PRN, Ivor Costa, MD, 1 tablet at 08/08/21 1710 .  sertraline (ZOLOFT) tablet 100 mg, 100 mg, Oral, Daily, Ivor Costa, MD, 100 mg at 08/08/21 8185    ALLERGIES   Aspirin and Sulfonamide derivatives     REVIEW OF SYSTEMS    Review of Systems:  Gen:  Denies  fever, sweats, chills weigh loss  HEENT: Denies blurred vision, double vision, ear pain, eye pain, hearing loss, nose bleeds, sore throat Cardiac:  No dizziness, chest pain or heaviness, chest tightness,edema Resp:   Denies cough or sputum porduction, shortness of breath,wheezing, hemoptysis,  Gi: Denies swallowing difficulty, stomach pain, nausea or vomiting, diarrhea, constipation, bowel incontinence Gu:  Denies bladder incontinence, burning urine Ext:   Denies Joint pain, stiffness or swelling Skin: Denies  skin rash, easy bruising or bleeding or hives Endoc:  Denies polyuria, polydipsia , polyphagia or weight change Psych:   Denies depression, insomnia or hallucinations   Other:  All other systems negative   VS: BP (!) 142/71   Pulse (!) 103   Temp 98.5 F (36.9 C) (Oral)   Resp 20   Ht $R'5\' 10"'Pw$  (1.778 m)   Wt 99.8 kg   SpO2 98%   BMI 31.57 kg/m      PHYSICAL EXAM    GENERAL:NAD, no fevers, chills, no weakness no fatigue HEAD: Normocephalic, atraumatic.  EYES: Pupils equal, round, reactive to light. Extraocular muscles intact. No scleral icterus.  MOUTH:  Moist mucosal membrane. Dentition intact. No abscess noted.  EAR, NOSE, THROAT: Clear without exudates. No external lesions.  NECK: Supple. No thyromegaly. No nodules. No JVD.  PULMONARY: bilateral rhonchi worse at left base  CARDIOVASCULAR: S1 and S2. Regular rate and rhythm. No murmurs, rubs, or gallops. No edema. Pedal pulses 2+ bilaterally.  GASTROINTESTINAL: Soft, nontender, nondistended. No masses. Positive bowel sounds. No hepatosplenomegaly.  MUSCULOSKELETAL: No swelling, clubbing, or edema. Range of motion full in all extremities.  NEUROLOGIC: Cranial nerves II through XII are intact. No gross focal neurological deficits. Sensation intact. Reflexes intact.  SKIN: No ulceration, lesions, rashes, or cyanosis. Skin warm and dry. Turgor intact.  PSYCHIATRIC: Mood, affect within normal limits. The patient is awake,  alert and oriented x 3. Insight, judgment intact.       IMAGING    DG Chest 2 View  Result Date: 08/06/2021 CLINICAL DATA:  50 year old male with shortness of breath, pulmonary embolism. EXAM: CHEST - 2 VIEW COMPARISON:  08/05/2021, 08/03/2021 FINDINGS: The mediastinal contours are partially obscured, unchanged. No cardiomegaly. Similar elevation left hemidiaphragm with left basilar consolidative opacity in obscuration of the left costophrenic angle. Subsegmental atelectasis in the right lung base. No acute osseous abnormality. IMPRESSION: Similar appearing left basilar opacity. Differential continues to include infarction, effusion, or mucous plugging. Repeat chest CT may be more illustratrative as clinically appropriate. Electronically Signed   By: Ruthann Cancer M.D.   On: 08/06/2021 15:32   CT CHEST WO CONTRAST  Result Date: 08/07/2021 CLINICAL DATA:  Shortness of breath and left-sided chest pain. EXAM: CT CHEST WITHOUT CONTRAST TECHNIQUE: Multidetector CT imaging of the chest was performed following the standard protocol without IV contrast. COMPARISON:  Chest radiograph  dated 08/06/2021 and CT chest dated 08/03/2021. FINDINGS: Cardiovascular: No significant vascular findings. Normal heart size. No pericardial effusion. Mediastinum/Nodes: No enlarged mediastinal or axillary lymph nodes. Thyroid gland, trachea, and esophagus demonstrate no significant findings. Lungs/Pleura: There are large left and small right pleural effusions with associated atelectasis, new since 08/03/2021. Moderate ground-glass opacities are seen in both upper lobes with areas of consolidation in the left lower and left upper lobes, new since 08/03/2021. There is no pneumothorax. Upper Abdomen: No acute abnormality. Musculoskeletal: No chest wall mass or suspicious bone lesions identified. IMPRESSION: 1. Large left and small right pleural effusions with associated atelectasis. 2. Moderate ground-glass opacities in both lungs may represent pneumonia. Electronically Signed   By: Zerita Boers M.D.   On: 08/07/2021 10:24   CT Angio Chest PE W/Cm &/Or Wo Cm  Result Date: 08/03/2021 CLINICAL DATA:  Evaluate for pulmonary embolus EXAM: CT ANGIOGRAPHY CHEST WITH CONTRAST TECHNIQUE: Multidetector CT imaging of the chest was performed using the standard protocol during bolus administration of intravenous contrast. Multiplanar CT image reconstructions and MIPs were obtained to evaluate the vascular anatomy. CONTRAST:  31mL OMNIPAQUE IOHEXOL 350 MG/ML SOLN COMPARISON:  Chest CT dated July 25, 2011 FINDINGS: Cardiovascular: Adequate opacification of the pulmonary arteries. Intraluminal filling defect of segmental and subsegmental left lower lobe pulmonary arteries. Normal heart size with no CT evidence of right heart strain. No pericardial effusion. No significant coronary artery calcifications or atherosclerotic disease of the thoracic aorta. Mediastinum/Nodes: Esophagus and thyroid are unremarkable. No pathologically enlarged lymph nodes seen in the chest. Lungs/Pleura: Central airways are patent. Left lower lobe  linear opacities, likely due to scarring or atelectasis. No consolidation, pleural effusion or pneumothorax. Upper Abdomen: No acute abnormality. Musculoskeletal: No chest wall abnormality. No acute or significant osseous findings. Review of the MIP images confirms the above findings. IMPRESSION: Pulmonary embolus of segmental and subsegmental left lower lobe pulmonary arteries. No CT evidence of right heart strain. Critical Value/emergent results were called by telephone at the time of interpretation on 08/03/2021 at 1:58 pm to provider Ellender Hose, MD, who verbally acknowledged these results. Electronically Signed   By: Yetta Glassman M.D.   On: 08/03/2021 13:59   US Venous Img Lower Bilateral (DVT)  Result Date: 08/03/2021 CLINICAL DATA:  Pulmonary embolism. EXAM: BILATERAL LOWER EXTREMITY VENOUS DOPPLER ULTRASOUND TECHNIQUE: Gray-scale sonography with graded compression, as well as color Doppler and duplex ultrasound were performed to evaluate the lower extremity deep venous systems from the level of the common femoral vein and including  the common femoral, femoral, profunda femoral, popliteal and calf veins including the posterior tibial, peroneal and gastrocnemius veins when visible. The superficial great saphenous vein was also interrogated. Spectral Doppler was utilized to evaluate flow at rest and with distal augmentation maneuvers in the common femoral, femoral and popliteal veins. COMPARISON:  None. FINDINGS: RIGHT LOWER EXTREMITY Common Femoral Vein: No evidence of thrombus. Normal compressibility, respiratory phasicity and response to augmentation. Saphenofemoral Junction: No evidence of thrombus. Normal compressibility and flow on color Doppler imaging. Profunda Femoral Vein: No evidence of thrombus. Normal compressibility and flow on color Doppler imaging. Femoral Vein: No evidence of thrombus. Normal compressibility, respiratory phasicity and response to augmentation. Popliteal Vein: No evidence of  thrombus. Normal compressibility, respiratory phasicity and response to augmentation. Calf Veins: No evidence of thrombus. Normal compressibility and flow on color Doppler imaging. Superficial Great Saphenous Vein: No evidence of thrombus. Normal compressibility. Venous Reflux:  None. Other Findings: No evidence of superficial thrombophlebitis or abnormal fluid collection. LEFT LOWER EXTREMITY Common Femoral Vein: No evidence of thrombus. Normal compressibility, respiratory phasicity and response to augmentation. Saphenofemoral Junction: No evidence of thrombus. Normal compressibility and flow on color Doppler imaging. Profunda Femoral Vein: No evidence of thrombus. Normal compressibility and flow on color Doppler imaging. Femoral Vein: No evidence of thrombus. Normal compressibility, respiratory phasicity and response to augmentation. Popliteal Vein: No evidence of thrombus. Normal compressibility, respiratory phasicity and response to augmentation. Calf Veins: No evidence of thrombus. Normal compressibility and flow on color Doppler imaging. Superficial Great Saphenous Vein: No evidence of thrombus. Normal compressibility. Venous Reflux:  None. Other Findings: There is thrombus noted in the left short saphenous vein behind the knee and extending into the calf. This thrombus does not visibly extend into the left popliteal vein. IMPRESSION: No evidence of deep venous thrombosis in either lower extremity. Superficial thrombophlebitis noted in the left short saphenous vein. Electronically Signed   By: Aletta Edouard M.D.   On: 08/03/2021 16:59   US Venous Img Upper Bilat (DVT)  Result Date: 08/07/2021 CLINICAL DATA:  50 year old male with history of pulmonary embolism. EXAM: BILATERAL UPPER EXTREMITY VENOUS DOPPLER ULTRASOUND TECHNIQUE: Gray-scale sonography with graded compression, as well as color Doppler and duplex ultrasound were performed to evaluate the bilateral upper extremity deep venous systems from  the level of the subclavian vein and including the jugular, axillary, basilic, radial, ulnar and upper cephalic vein. Spectral Doppler was utilized to evaluate flow at rest and with distal augmentation maneuvers. COMPARISON:  None. FINDINGS: RIGHT UPPER EXTREMITY Internal Jugular Vein: No evidence of thrombus. Normal compressibility, respiratory phasicity and response to augmentation. Subclavian Vein: No evidence of thrombus. Normal compressibility, respiratory phasicity and response to augmentation. Axillary Vein: No evidence of thrombus. Normal compressibility, respiratory phasicity and response to augmentation. Cephalic Vein: No evidence of thrombus. Normal compressibility, respiratory phasicity and response to augmentation. Basilic Vein: No evidence of thrombus. Normal compressibility, respiratory phasicity and response to augmentation. Brachial Veins: No evidence of thrombus. Normal compressibility, respiratory phasicity and response to augmentation. Radial Veins: No evidence of thrombus. Normal compressibility, respiratory phasicity and response to augmentation. Ulnar Veins: No evidence of thrombus. Normal compressibility, respiratory phasicity and response to augmentation. Other Findings:  None. LEFT UPPER EXTREMITY Internal Jugular Vein: No evidence of thrombus. Normal compressibility, respiratory phasicity and response to augmentation. Subclavian Vein: No evidence of thrombus. Normal compressibility, respiratory phasicity and response to augmentation. Axillary Vein: No evidence of thrombus. Normal compressibility, respiratory phasicity and response to augmentation. Cephalic Vein: No evidence of  thrombus. Normal compressibility, respiratory phasicity and response to augmentation. Basilic Vein: No evidence of thrombus. Normal compressibility, respiratory phasicity and response to augmentation. Brachial Veins: No evidence of thrombus. Normal compressibility, respiratory phasicity and response to augmentation.  Radial Veins: No evidence of thrombus. Normal compressibility, respiratory phasicity and response to augmentation. Ulnar Veins: No evidence of thrombus. Normal compressibility, respiratory phasicity and response to augmentation. Other Findings:  None. IMPRESSION: No evidence of deep vein thrombosis within either upper extremity. Ruthann Cancer, MD [facility] Electronically Signed   By: Ruthann Cancer M.D.   On: 08/07/2021 10:50   DG Chest Port 1 View  Result Date: 08/05/2021 CLINICAL DATA:  50 year old male shortness of breath. Chest pain and diaphoresis. Positive left pulmonary embolus on recent CTA. EXAM: PORTABLE CHEST 1 VIEW COMPARISON:  CTA chest 08/03/2021 and earlier. FINDINGS: Portable AP upright view at 0905 hours. Lower lung volumes with increasing dense opacity at the left lung base most resembling pleural effusion. No pulmonary edema or pneumothorax. Visible mediastinal contours remain stable. Patchy increased right lung opacity more resembling atelectasis. No acute osseous abnormality identified. Negative visible bowel gas. IMPRESSION: Lower lung volumes with increasing dense opacification at the left lung base. Favor acute pleural effusion, although pulmonary infarct not excluded in this setting. Increased right lung base atelectasis. Electronically Signed   By: Genevie Ann M.D.   On: 08/05/2021 09:37      ASSESSMENT/PLAN     Acute on chronic hypoxemic respiratory failure  -S/p PE and with pleural effusion    -have discused with patient and family   - patient is on eliquis    Will check fluid studies including culture to evaluate for hemothorax vs empyema  - patient scheduled for thoracentesis today    Atelectasis of left lung  - Bronchopulmonary hygiene  -repeat chest imaging with less fluid and more atelectasis   Left lung bronchopneumonia - on zithromax and rocephin   Acute PE - on eliquis  - vascular surgery is on  case plan for procedure today  Sickle Cell disease SF/SHP - patient has not had any treatment and has never been hospitalized for sickle cell disease     Thank you for allowing me to participate in the care of this patient.    Patient/Family are satisfied with care plan and all questions have been answered.   This document was prepared using Dragon voice recognition software and may include unintentional dictation errors.     Ottie Glazier, M.D.  Division of [place]

## 2021-08-09 NOTE — Interval H&P Note (Signed)
History and Physical Interval Note:  08/09/2021 4:01 PM  William Lin  has presented today for surgery, with the diagnosis of PE.  The various methods of treatment have been discussed with the patient and family. After consideration of risks, benefits and other options for treatment, the patient has consented to  Procedure(s): PULMONARY THROMBECTOMY (Bilateral) as a surgical intervention.  The patient's history has been reviewed, patient examined, no change in status, stable for surgery.  I have reviewed the patient's chart and labs.  Questions were answered to the patient's satisfaction.     Levora Dredge

## 2021-08-09 NOTE — Progress Notes (Signed)
OT Cancellation Note  Patient Details Name: Travell Desaulniers MRN: [id number] DOB: Jul 16, 1971   Cancelled Treatment:    Reason Eval/Treat Not Completed: Other (comment). Pt now scheduled for thrombectomy today. Will hold OT Tx and re-attempt at later date/time as medically appropriate after procedure.   Arman Filter., MPH, MS, OTR/L ascom [phone] 08/09/21, 2:20 PM

## 2021-08-09 NOTE — H&P (View-Only) (Signed)
[facility] Vascular Consult Note  MRN : [id number]  William Lin is a 50 y.o. ([birth year]) male who presents with chief complaint of  Chief Complaint  Patient presents with   abnormal labs   History of Present Illness:  This is a 50 year old with hx of DM, MDD, anxiety , pleural effusions, chronic anemia of unclear diagnosis possible genetic came in with complaints of chest discomfort which was severe and with left arm radiation.   He denies URI symptoms, sick contacts , NVD, admits to cross country travel to [place] and [place].  He had CT angio done showing Left sided PE's no RH strain. His H/h started to trend down and patient developed pleural effusion. PCCM for further evaluation and management. Hes on 3-4L/min [place] for supplemental O2.    Patient was on interventional radiology schedule yesterday for image guided thoracentesis however ultrasound did not reveal any sufficient amount of fluid for intervention.  Vascular surgery was consulted by Dr. Lanney Gins for possible intervention in the setting of pulmonary embolism.  Current Facility-Administered Medications  Medication Dose Route Frequency Provider Last Rate Last Admin   0.9 %  sodium chloride infusion   Intravenous Continuous Todd Jelinski A, PA-C 75 mL/hr at 08/08/21 2351 New Bag at 08/08/21 2351   acetaminophen (TYLENOL) tablet 650 mg  650 mg Oral Q6H PRN Ivor Costa, MD   650 mg at 08/08/21 0902   albuterol (VENTOLIN HFA) 108 (90 Base) MCG/ACT inhaler 2 puff  2 puff Inhalation Q4H PRN Ivor Costa, MD       apixaban Arne Cleveland) tablet 10 mg  10 mg Oral BID Rito Ehrlich A, RPH   10 mg at 08/09/21 0830   Followed by   Derrill Memo ON 08/11/2021] apixaban (ELIQUIS) tablet 5 mg  5 mg Oral BID Nazari, Walid A, RPH       cefTRIAXone (ROCEPHIN) 2 g in sodium chloride 0.9 % 100 mL IVPB  2 g Intravenous Q24H [name], MD 200 mL/hr at 08/08/21 1230 2 g at 08/08/21 1230   dextromethorphan-guaiFENesin (MUCINEX  DM) 30-600 MG per 12 hr tablet 1 tablet  1 tablet Oral BID PRN Ivor Costa, MD   1 tablet at 08/08/21 0210   ferrous [id number] C-folic acid (TRINSICON / FOLTRIN) capsule 1 capsule  1 capsule Oral TID Philis Fendt, MD   1 capsule at 08/09/21 0831   hydrALAZINE (APRESOLINE) injection 5 mg  5 mg Intravenous Q2H PRN Ivor Costa, MD       HYDROmorphone (DILAUDID) injection 1 mg  1 mg Intravenous Q4H PRN [name], MD   1 mg at 08/08/21 2207   insulin aspart (novoLOG) injection 0-15 Units  0-15 Units Subcutaneous TID WC [name], MD   3 Units at 08/09/21 0830   insulin aspart (novoLOG) injection 0-5 Units  0-5 Units Subcutaneous QHS Ivor Costa, MD   4 Units at 08/05/21 2215   insulin aspart (novoLOG) injection 8 Units  8 Units Subcutaneous TID WC [name], MD   8 Units at 08/08/21 1711   insulin glargine-yfgn (SEMGLEE) injection 30 Units  30 Units Subcutaneous BID [name], MD   30 Units at 08/08/21 2207   ipratropium-albuterol (DUONEB) 0.5-2.5 (3) MG/3ML nebulizer solution 3 mL  3 mL Nebulization Q6H Amin, Soundra Pilon, MD       lisinopril (ZESTRIL) tablet 10 mg  10 mg Oral Daily [name], MD   10 mg at 08/09/21 0831   nitroGLYCERIN (NITROSTAT) SL tablet 0.4 mg  0.4 mg Sublingual Q5 min PRN [name], MD   0.4 mg at 08/05/21 0852   ondansetron (ZOFRAN) injection 4 mg  4 mg Intravenous Q8H PRN Ivor Costa, MD   4 mg at 08/05/21 1641   oxyCODONE-acetaminophen (PERCOCET/ROXICET) 5-325 MG per tablet 1 tablet  1 tablet Oral Q4H PRN Ivor Costa, MD   1 tablet at 08/09/21 6295   potassium chloride SA (KLOR-CON) CR tablet 40 mEq  40 mEq Oral Q4H [name], MD   40 mEq at 08/09/21 0831   sertraline (ZOLOFT) tablet 100 mg  100 mg Oral Daily Ivor Costa, MD   100 mg at 08/09/21 0830   Past Medical History:  Diagnosis Date   Anxiety    Diabetes mellitus without complication (HCC)    HTN (hypertension)    Sickle cell anemia (Organ)    Past Surgical History:  Procedure  Laterality Date   CHOLECYSTECTOMY     DENTAL SURGERY     polynodial cyst     ROTATOR CUFF REPAIR     Left   Social History Social History   Tobacco Use   Smoking status: Never   Smokeless tobacco: Never  Substance Use Topics   Alcohol use: No   Drug use: Never   Family History Family History  Problem Relation Age of Onset   Hypertension Other    Hypertension Mother    Arthritis Mother   Denies family history of peripheral artery disease, venous disease or renal/clotting disorders.  Allergies  Allergen Reactions   Aspirin    Sulfonamide Derivatives    REVIEW OF SYSTEMS (Negative unless checked)  Constitutional: _0 Weight loss  _1 Fever  _2 Chills Cardiac: _3 Chest pain   _4 Chest pressure   _5 Palpitations   _6 Shortness of breath when laying flat   _7 Shortness of breath at rest   _8 Shortness of breath with exertion. Vascular:  _9 Pain in legs with walking   _10 Pain in legs at rest   _11 Pain in legs when laying flat   _12 Claudication   _13 Pain in feet when walking  _14 Pain in feet at rest  _15 Pain in feet when laying flat   _16 History of DVT   _17 Phlebitis   _18 Swelling in legs   _19 Varicose veins   _20 Non-healing ulcers Pulmonary:   _21 Uses home oxygen   _22 Productive cough   _23 Hemoptysis   _24 Wheeze  _25 COPD   _26 Asthma Neurologic:  _27 Dizziness  _28 Blackouts   _29 Seizures   _30 History of stroke   _31 History of TIA  _32 Aphasia   _33 Temporary blindness   _34 Dysphagia   _35 Weakness or numbness in arms   _36 Weakness or numbness in legs Musculoskeletal:  _37 Arthritis   _38 Joint swelling   _39 Joint pain   _40 Low back pain Hematologic:  _41 Easy bruising  _42 Easy bleeding   _43 Hypercoagulable state   _44 Anemic  _45 Hepatitis Gastrointestinal:  _46 Blood in stool   _47 Vomiting blood  _48 Gastroesophageal reflux/heartburn   _49 Difficulty swallowing. Genitourinary:  _50 Chronic kidney disease   _51 Difficult urination  _52 Frequent urination  _53 Burning with urination   _54 Blood in urine Skin:  _55 Rashes   _56 Ulcers    _57 Wounds Psychological:  _58 History of anxiety   _59  History of major depression.  Physical Examination  Vitals:   08/09/21 0400 08/09/21 0434 08/09/21 0756 08/09/21 0813  BP: (!) 142/71  (!) 145/82   Pulse: (!) 103  98 97  Resp: _60 Temp: 98.5 F (36.9 C)  98.7 F (37.1 C)   TempSrc: Oral     SpO2: 98% 98% 98% 97%  Weight:  Height:       Body mass index is 31.57 kg/m. Gen:  WD/WN, NAD Head: [place]/AT, No temporalis wasting. Prominent temp pulse not noted. Ear/Nose/Throat: Hearing grossly intact, nares w/o erythema or drainage, oropharynx w/o Erythema/Exudate Eyes: Sclera non-icteric, conjunctiva clear Neck: Trachea midline.  No JVD.  Pulmonary: On supplemental oxygen.  Minimally short of breath with talking.  Non-labored. Cardiac: RRR, normal S1, S2. Vascular:  Vessel Right Left  Radial Palpable Palpable  Ulnar Palpable Palpable   Gastrointestinal: soft, non-tender/non-distended. No guarding/reflex.  Musculoskeletal: M/S 5/5 throughout.  Extremities without ischemic changes.  No deformity or atrophy. No edema. Neurologic: Sensation grossly intact in extremities.  Symmetrical.  Speech is fluent. Motor exam as listed above. Psychiatric: Judgment intact, Mood & affect appropriate for pt's clinical situation. Dermatologic: No rashes or ulcers noted.  No cellulitis or open wounds. Lymph : No Cervical, Axillary, or Inguinal lymphadenopathy.  CBC Lab Results  Component Value Date   WBC 12.1 (H) 08/09/2021   HGB 9.0 (L) 08/09/2021   HCT 25.2 (L) 08/09/2021   MCV 67.9 (L) 08/09/2021   PLT 203 08/09/2021   BMET    Component Value Date/Time   NA 139 08/09/2021 0447   K 2.8 (L) 08/09/2021 0447   CL 100 08/09/2021 0447   CO2 29 08/09/2021 0447   GLUCOSE 181 (H) 08/09/2021 0447   BUN 9 08/09/2021 0447   CREATININE 0.75 08/09/2021 0447   CALCIUM 9.1 08/09/2021 0447   GFRNONAA >60 08/09/2021 0447   GFRAA  10/16/2009 2228    >60        The eGFR has been  calculated using the MDRD equation. This calculation has not been validated in all clinical situations. eGFR's persistently <60 mL/min signify possible Chronic Kidney Disease.   Estimated Creatinine Clearance: 127.9 mL/min (by C-G formula based on SCr of 0.75 mg/dL).  COAG No results found for: INR, PROTIME  Radiology DG Chest 2 View  Result Date: 08/06/2021 CLINICAL DATA:  50 year old male with shortness of breath, pulmonary embolism. EXAM: CHEST - 2 VIEW COMPARISON:  08/05/2021, 08/03/2021 FINDINGS: The mediastinal contours are partially obscured, unchanged. No cardiomegaly. Similar elevation left hemidiaphragm with left basilar consolidative opacity in obscuration of the left costophrenic angle. Subsegmental atelectasis in the right lung base. No acute osseous abnormality. IMPRESSION: Similar appearing left basilar opacity. Differential continues to include infarction, effusion, or mucous plugging. Repeat chest CT may be more illustratrative as clinically appropriate. Electronically Signed   By: Ruthann Cancer M.D.   On: 08/06/2021 15:32   CT CHEST WO CONTRAST  Result Date: 08/07/2021 CLINICAL DATA:  Shortness of breath and left-sided chest pain. EXAM: CT CHEST WITHOUT CONTRAST TECHNIQUE: Multidetector CT imaging of the chest was performed following the standard protocol without IV contrast. COMPARISON:  Chest radiograph dated 08/06/2021 and CT chest dated 08/03/2021. FINDINGS: Cardiovascular: No significant vascular findings. Normal heart size. No pericardial effusion. Mediastinum/Nodes: No enlarged mediastinal or axillary lymph nodes. Thyroid gland, trachea, and esophagus demonstrate no significant findings. Lungs/Pleura: There are large left and small right pleural effusions with associated atelectasis, new since 08/03/2021. Moderate ground-glass opacities are seen in both upper lobes with areas of consolidation in the left lower and left upper lobes, new since 08/03/2021. There is no  pneumothorax. Upper Abdomen: No acute abnormality. Musculoskeletal: No chest wall mass or suspicious bone lesions identified. IMPRESSION: 1. Large left and small right pleural effusions with associated atelectasis. 2. Moderate ground-glass opacities in both lungs may represent pneumonia. Electronically Signed   By:  Zerita Boers M.D.   On: 08/07/2021 10:24   CT Angio Chest PE W/Cm &/Or Wo Cm  Result Date: 08/03/2021 CLINICAL DATA:  Evaluate for pulmonary embolus EXAM: CT ANGIOGRAPHY CHEST WITH CONTRAST TECHNIQUE: Multidetector CT imaging of the chest was performed using the standard protocol during bolus administration of intravenous contrast. Multiplanar CT image reconstructions and MIPs were obtained to evaluate the vascular anatomy. CONTRAST:  25m OMNIPAQUE IOHEXOL 350 MG/ML SOLN COMPARISON:  Chest CT dated July 25, 2011 FINDINGS: Cardiovascular: Adequate opacification of the pulmonary arteries. Intraluminal filling defect of segmental and subsegmental left lower lobe pulmonary arteries. Normal heart size with no CT evidence of right heart strain. No pericardial effusion. No significant coronary artery calcifications or atherosclerotic disease of the thoracic aorta. Mediastinum/Nodes: Esophagus and thyroid are unremarkable. No pathologically enlarged lymph nodes seen in the chest. Lungs/Pleura: Central airways are patent. Left lower lobe linear opacities, likely due to scarring or atelectasis. No consolidation, pleural effusion or pneumothorax. Upper Abdomen: No acute abnormality. Musculoskeletal: No chest wall abnormality. No acute or significant osseous findings. Review of the MIP images confirms the above findings. IMPRESSION: Pulmonary embolus of segmental and subsegmental left lower lobe pulmonary arteries. No CT evidence of right heart strain. Critical Value/emergent results were called by telephone at the time of interpretation on 08/03/2021 at 1:58 pm to provider IEllender Hose MD, who verbally  acknowledged these results. Electronically Signed   By: LYetta GlassmanM.D.   On: 08/03/2021 13:59   [place] (PLEURAL EFFUSION)  Result Date: 08/09/2021 CLINICAL DATA:  Evaluate pleural fluid for thoracentesis EXAM: CHEST ULTRASOUND COMPARISON:  CT chest October 23 FINDINGS: Focused sonographic exam of the bilateral chest was performed. Submitted images demonstrate trace pleural fluid bilaterally. There is insufficient fluid to perform safe image guided thoracentesis. IMPRESSION: Focused sonographic exam of the bilateral chest demonstrates trace bilateral pleural effusions. Fluid volume is insufficient for safe image guided thoracentesis. When this study is used in conjunction with recent CT chest, it is felt like findings in the chest likely represent more consolidated lung, and less pleural fluid. Electronically Signed   By: YAlbin FellingM.D.   On: 08/09/2021 07:44   [place]ous Img Lower Bilateral (DVT)  Result Date: 08/03/2021 CLINICAL DATA:  Pulmonary embolism. EXAM: BILATERAL LOWER EXTREMITY VENOUS DOPPLER ULTRASOUND TECHNIQUE: Gray-scale sonography with graded compression, as well as color Doppler and duplex ultrasound were performed to evaluate the lower extremity deep venous systems from the level of the common femoral vein and including the common femoral, femoral, profunda femoral, popliteal and calf veins including the posterior tibial, peroneal and gastrocnemius veins when visible. The superficial great saphenous vein was also interrogated. Spectral Doppler was utilized to evaluate flow at rest and with distal augmentation maneuvers in the common femoral, femoral and popliteal veins. COMPARISON:  None. FINDINGS: RIGHT LOWER EXTREMITY Common Femoral Vein: No evidence of thrombus. Normal compressibility, respiratory phasicity and response to augmentation. Saphenofemoral Junction: No evidence of thrombus. Normal compressibility and flow on color Doppler imaging. Profunda Femoral Vein: No  evidence of thrombus. Normal compressibility and flow on color Doppler imaging. Femoral Vein: No evidence of thrombus. Normal compressibility, respiratory phasicity and response to augmentation. Popliteal Vein: No evidence of thrombus. Normal compressibility, respiratory phasicity and response to augmentation. Calf Veins: No evidence of thrombus. Normal compressibility and flow on color Doppler imaging. Superficial Great Saphenous Vein: No evidence of thrombus. Normal compressibility. Venous Reflux:  None. Other Findings: No evidence of superficial thrombophlebitis or abnormal fluid collection. LEFT LOWER EXTREMITY  Common Femoral Vein: No evidence of thrombus. Normal compressibility, respiratory phasicity and response to augmentation. Saphenofemoral Junction: No evidence of thrombus. Normal compressibility and flow on color Doppler imaging. Profunda Femoral Vein: No evidence of thrombus. Normal compressibility and flow on color Doppler imaging. Femoral Vein: No evidence of thrombus. Normal compressibility, respiratory phasicity and response to augmentation. Popliteal Vein: No evidence of thrombus. Normal compressibility, respiratory phasicity and response to augmentation. Calf Veins: No evidence of thrombus. Normal compressibility and flow on color Doppler imaging. Superficial Great Saphenous Vein: No evidence of thrombus. Normal compressibility. Venous Reflux:  None. Other Findings: There is thrombus noted in the left short saphenous vein behind the knee and extending into the calf. This thrombus does not visibly extend into the left popliteal vein. IMPRESSION: No evidence of deep venous thrombosis in either lower extremity. Superficial thrombophlebitis noted in the left short saphenous vein. Electronically Signed   By: Aletta Edouard M.D.   On: 08/03/2021 16:59   US Venous Img Upper Bilat (DVT)  Result Date: 08/07/2021 CLINICAL DATA:  50 year old male with history of pulmonary embolism. EXAM: BILATERAL UPPER  EXTREMITY VENOUS DOPPLER ULTRASOUND TECHNIQUE: Gray-scale sonography with graded compression, as well as color Doppler and duplex ultrasound were performed to evaluate the bilateral upper extremity deep venous systems from the level of the subclavian vein and including the jugular, axillary, basilic, radial, ulnar and upper cephalic vein. Spectral Doppler was utilized to evaluate flow at rest and with distal augmentation maneuvers. COMPARISON:  None. FINDINGS: RIGHT UPPER EXTREMITY Internal Jugular Vein: No evidence of thrombus. Normal compressibility, respiratory phasicity and response to augmentation. Subclavian Vein: No evidence of thrombus. Normal compressibility, respiratory phasicity and response to augmentation. Axillary Vein: No evidence of thrombus. Normal compressibility, respiratory phasicity and response to augmentation. Cephalic Vein: No evidence of thrombus. Normal compressibility, respiratory phasicity and response to augmentation. Basilic Vein: No evidence of thrombus. Normal compressibility, respiratory phasicity and response to augmentation. Brachial Veins: No evidence of thrombus. Normal compressibility, respiratory phasicity and response to augmentation. Radial Veins: No evidence of thrombus. Normal compressibility, respiratory phasicity and response to augmentation. Ulnar Veins: No evidence of thrombus. Normal compressibility, respiratory phasicity and response to augmentation. Other Findings:  None. LEFT UPPER EXTREMITY Internal Jugular Vein: No evidence of thrombus. Normal compressibility, respiratory phasicity and response to augmentation. Subclavian Vein: No evidence of thrombus. Normal compressibility, respiratory phasicity and response to augmentation. Axillary Vein: No evidence of thrombus. Normal compressibility, respiratory phasicity and response to augmentation. Cephalic Vein: No evidence of thrombus. Normal compressibility, respiratory phasicity and response to augmentation. Basilic  Vein: No evidence of thrombus. Normal compressibility, respiratory phasicity and response to augmentation. Brachial Veins: No evidence of thrombus. Normal compressibility, respiratory phasicity and response to augmentation. Radial Veins: No evidence of thrombus. Normal compressibility, respiratory phasicity and response to augmentation. Ulnar Veins: No evidence of thrombus. Normal compressibility, respiratory phasicity and response to augmentation. Other Findings:  None. IMPRESSION: No evidence of deep vein thrombosis within either upper extremity. Ruthann Cancer, MD [facility] Radiology Electronically Signed   By: Ruthann Cancer M.D.   On: 08/07/2021 10:50   DG Chest Port 1 View  Result Date: 08/05/2021 CLINICAL DATA:  50 year old male shortness of breath. Chest pain and diaphoresis. Positive left pulmonary embolus on recent CTA. EXAM: PORTABLE CHEST 1 VIEW COMPARISON:  CTA chest 08/03/2021 and earlier. FINDINGS: Portable AP upright view at 0905 hours. Lower lung volumes with increasing dense opacity at the left lung base most resembling pleural effusion. No  pulmonary edema or pneumothorax. Visible mediastinal contours remain stable. Patchy increased right lung opacity more resembling atelectasis. No acute osseous abnormality identified. Negative visible bowel gas. IMPRESSION: Lower lung volumes with increasing dense opacification at the left lung base. Favor acute pleural effusion, although pulmonary infarct not excluded in this setting. Increased right lung base atelectasis. Electronically Signed   By: Genevie Ann M.D.   On: 08/05/2021 09:37    Assessment/Plan This is a 50 year old with hx of DM, MDD, anxiety , pleural effusions, chronic anemia of unclear diagnosis possible genetic came in with complaints of chest discomfort which was severe and with left arm radiation found to have pulmonary embolism.  1.  Pulmonary Embolism: Patient presented to the [facility] emergency department with progressively worsening chest discomfort and shortness of breath.  Found to have pulmonary embolism on CTA of the chest.  Patient was initiated on heparin now on Eliquis.  Patient still with significant shortness of breath.  Patient was on interventional radiology schedule yesterday for a pleural thoracentesis however the fluid collection was not large enough to be drained.  In the setting of continued significant shortness of breath, need for supplemental oxygen as well as failure for symptoms improve recommend the patient undergo a pulmonary thrombectomy with possible thrombolysis and attempt to lessen the clot burden and improve his symptoms.  Procedure, risks and benefits were explained to the patient.  All questions were answered.  The patient wished to proceed.  We will plan on this today with Dr. Delana Meyer.  2.  Rule out DVT: No DVT in the upper or lower extremity.  3.  Diabetes: On appropriate medications Encouraged good control as its slows the progression of atherosclerotic disease   Discussed with Dr. Francene Castle, PA-C 08/09/2021 10:08 AM  This note was created with Dragon medical transcription system.  Any error is purely unintentional.

## 2021-08-09 NOTE — Progress Notes (Signed)
PT Cancellation Note  Patient Details Name: Jd Mccaster MRN: [id number] DOB: August 10, 1971   Cancelled Treatment:    Reason Eval/Treat Not Completed: Medical issues which prohibited therapy: Pt's current K 2.8 falling outside guidelines for participation with PT services.  Per chart review pt also scheduled for thrombectomy later this date.  Will attempt to see pt at a future date/time as medically appropriate.    Ovidio Hanger PT, DPT 08/09/21, 2:04 PM

## 2021-08-09 NOTE — Progress Notes (Signed)
PROGRESS NOTE    William Lin  PFX:[id number] DOB: 05/08/1971 DOA: 08/03/2021 PCP: [facility] @ [place]   Brief Narrative: Taken from prior notes. This 50 years old male with PMH significant for hypertension, diabetes, depression, anxiety,  questionable history of sickle cell anemia ( mild variant no anemia never had any crisis ) per patient presented in the ED with chest pain. Patient reports long distance travel recently.  He flew to [place] a week ago and then [place] a month ago.  Patient also reported discomfort in the left calf area which has resolved a week ago.  Patient was found to have elevated D-dimer and was sent in the ED.  CTA chest confirms pulmonary embolism on left basal subsegmental level, no right heart strain.  Patient initially started on heparin infusion as there was no hypoxia he was transitioned to Eliquis.  Bilateral lower extremity Doppler was negative for DVT did show a small superficial thrombophlebitis in left short saphenous vein.  10/21: Patient developed worsening left-sided chest pain, associated with diaphoresis, tachycardia and tachypnea.  EKG without any significant ST changes and troponin remained negative. Morphine followed by Toradol help with the pain. Also consulted PCCM and they showed a concern of acute chest with his questionable history of sickle cell. Contacted sickle cell team at [facility] long, they are not very impressed with acute chest or even sickle cell crisis with no prior history.  Recommended to testing for sickle cell if he has a real diagnosis-labs ordered.  Sickle cell screen came back positive, pending fractionated hemoglobin.  10/22: Smear with target cells and microcytosis more consistent with thalassemia trait.  Talked with patient and according to him he is being followed up at [facility] sickle cell team and had that diagnosis since childhood.  Again denied any crisis.  10/23: Now requiring up to 4 L of oxygen, states  CT chest with worsening left pleural effusion and bilateral groundglass opacities which were new.  Thoracentesis ordered. Pulmonary consult.  10/24: Patient continued to have pleuritic left-sided chest pain, although improved than before.  Continue to require up to 3 L of oxygen.  Going for thoracentesis today.  Vascular surgery was consulted by pulmonology to see if any endovascular intervention needed.  10/25: Unable to obtain thoracentesis due to insufficient pleural fluid, repeat chest ultrasound-most likely consolidated lung.  Going for intra vascular intervention with vascular today. Sickle cell screen is positive.  Please try talking to [facility] sickle cell team again if patient does not improve after thrombectomy which is scheduled for later today.  Some concern of acute chest specially now he has positive sickle cell screen.  Subjective: Patient continue to have left sided chest pain which increases with breathing, having some cough. Wife at bedside.  Assessment & Plan:   Principal Problem:   Acute pulmonary embolism (HCC) Active Problems:   Diabetes mellitus without complication (HCC)   Depression with anxiety   HTN (hypertension)   Pulmonary embolism (HCC)  Chest pain/questionable sickle cell trait.  EKG without any significant ST changes and troponin remain negative.  Pain improved with morphine followed by Toradol.  Has a pleuritic component and it.  Chest x-ray with some left-sided density and a concern of left-sided pleural effusion. Patient had left basal PE.  Procalcitonin at 1.40.  Leukocytosis. Also contacted PCCM-they were advising to contact sickle cell team due to concern of acute chest.  Patient never had any sickle cell crisis and also not sure about his diagnosis. Contacted sickle  cell team at [facility] are not very impressed with his diagnosis at this time and advising just antibiotics and pain control at this time.  Also advised to do some sickle cell  labs which were ordered and if came back positive then they would like to see him in their sickle cell clinic.  LDH within normal limit.  Smear with few target cells and microcytosis more consistent with thalassemia trait.  Sickle cell screen is positive, fractionated hemoglobin pending. Might be pleuritic irritation with pneumonia or pulmonary infarct. -Repeat CT chest with worsening of left-sided pleural effusion and new bilateral groundglass opacities, unable to obtain thoracentesis due to insufficient pleural effusion on repeat chest [place]. Vascular surgery was consulted by pulmonology will be going for an intravascular intervention to decrease the burden of PE with thrombectomy and/or thrombolysis later today. -Continue ceftriaxone and Zithromax. -Continue with pain management -Follow-up hemoglobin fractionation   Acute anemia.  Hemoglobin continued to drop with no significant bleeding, today at 9.0, with no obvious bleeding.  Smear with concern of thalassemia trait at this time.  Repeat chest x-ray and CT chest with worsening of effusions, more on left, patient is on Eliquis.  Anemia panel with anemia of chronic disease and appears to have nutritional deficiencies which include iron, folic acid and borderline B12.  Ferritin elevated-not very reliable due to being acute phase reactant. -Transfuse if below 8 -Continue supplement -Monitor hemoglobin  Pulmonary embolism.  Most likely provoked as he has a recent history of long flights. -Continue with Eliquis -Continue with supplemental oxygen-wean as tolerated.  Type 2 diabetes mellitus.  CBG still elevated.  Uses Actos and metformin at home. -Continue with SSI -Increase Semglee to 30 units twice daily. -Increase mealtime coverage to 8 units.  Depression anxiety: Continue Zoloft   Hypertension: Blood pressure remained mildly elevated even with improvement of pain -Increase the home dose of lisinopril to 10 mg daily.  Objective: Vitals:    08/09/21 0400 08/09/21 0434 08/09/21 0756 08/09/21 0813  BP: (!) 142/71  (!) 145/82   Pulse: (!) 103  98 97  Resp: 20  16 16   Temp: 98.5 F (36.9 C)  98.7 F (37.1 C)   TempSrc: Oral     SpO2: 98% 98% 98% 97%  Weight:      Height:        Intake/Output Summary (Last 24 hours) at 08/09/2021 08/11/2021 Last data filed at 08/09/2021 0500 Gross per 24 hour  Intake 1345.27 ml  Output --  Net 1345.27 ml    Filed Weights   08/03/21 1214  Weight: 99.8 kg    Examination:  General.  Well-developed gentleman, in no acute distress. Pulmonary.  Decreased breath sound at bases, normal respiratory effort. CV.  Regular rate and rhythm, no JVD, rub or murmur. Abdomen.  Soft, nontender, nondistended, BS positive. CNS.  Alert and oriented x3.  No focal neurologic deficit. Extremities.  No edema, no cyanosis, pulses intact and symmetrical. Psychiatry.  Judgment and insight appears normal.   DVT prophylaxis: Eliquis Code Status: Full Family Communication: Discussed with wife at bedside. Disposition Plan:  Status is: Inpatient  Remains inpatient appropriate because: Of the severity of illness  Level of care: Progressive Cardiac  All the records are reviewed and case discussed with Care Management/Social Worker. Management plans discussed with the patient, nursing and they are in agreement.  Consultants:  PCCM Vascular surgery  Procedures:  Antimicrobials:  Ceftriaxone Zithromax  Data Reviewed: I have personally reviewed following labs and imaging studies  CBC: Recent Labs  Lab 08/05/21 0613 08/06/21 0941 08/07/21 1052 08/08/21 0900 08/09/21 0447  WBC 22.2* 15.1* 16.2* 12.9* 12.1*  NEUTROABS  --  12.0*  --  9.8*  --   HGB 11.2* 10.1* 10.3* 9.7* 9.0*  HCT 31.1* 27.9* 28.0* 26.6* 25.2*  MCV 71.3* 71.2* 69.1* 69.5* 67.9*  PLT 175 161 200 200 203    Basic Metabolic Panel: Recent Labs  Lab 08/03/21 1219 08/05/21 0613 08/07/21 1052 08/09/21 0447  NA 134* 134* 136 139   K 3.8 3.8 3.2* 2.8*  CL 98 100 96* 100  CO2 26 23 25 29   GLUCOSE 311* 280* 279* 181*  BUN 6 11 9 9   CREATININE 0.76 0.88 0.62 0.75  CALCIUM 9.6 9.3 9.2 9.1  MG  --  1.6*  --   --   PHOS  --  3.7  --   --     GFR: Estimated Creatinine Clearance: 127.9 mL/min (by C-G formula based on SCr of 0.75 mg/dL). Liver Function Tests: No results for input(s): AST, ALT, ALKPHOS, BILITOT, PROT, ALBUMIN in the last 168 hours. No results for input(s): LIPASE, AMYLASE in the last 168 hours. No results for input(s): AMMONIA in the last 168 hours. Coagulation Profile: No results for input(s): INR, PROTIME in the last 168 hours. Cardiac Enzymes: No results for input(s): CKTOTAL, CKMB, CKMBINDEX, TROPONINI in the last 168 hours. BNP (last 3 results) No results for input(s): PROBNP in the last 8760 hours. HbA1C: No results for input(s): HGBA1C in the last 72 hours.  CBG: Recent Labs  Lab 08/08/21 0800 08/08/21 1057 08/08/21 1509 08/08/21 2051 08/09/21 0813  GLUCAP 250* 278* 261* 144* 155*    Lipid Profile: No results for input(s): CHOL, HDL, LDLCALC, TRIG, CHOLHDL, LDLDIRECT in the last 72 hours. Thyroid Function Tests: No results for input(s): TSH, T4TOTAL, FREET4, T3FREE, THYROIDAB in the last 72 hours. Anemia Panel: Recent Labs    08/06/21 0941  FOLATE 5.7*  FERRITIN 920*  TIBC 230*  IRON 20*  RETICCTPCT 1.5    Sepsis Labs: Recent Labs  Lab 08/05/21 1440  PROCALCITON 1.40     Recent Results (from the past 240 hour(s))  Resp Panel by RT-PCR (Flu A&B, Covid) Nasopharyngeal Swab     Status: None   Collection Time: 08/03/21  3:25 PM   Specimen: Nasopharyngeal Swab; Nasopharyngeal(NP) swabs in vial transport medium  Result Value Ref Range Status   SARS Coronavirus 2 by RT PCR NEGATIVE NEGATIVE Final    Comment: (NOTE) SARS-CoV-2 target nucleic acids are NOT DETECTED.  The SARS-CoV-2 RNA is generally detectable in upper respiratory specimens during the acute phase of  infection. The lowest concentration of SARS-CoV-2 viral copies this assay can detect is 138 copies/mL. A negative result does not preclude SARS-Cov-2 infection and should not be used as the sole basis for treatment or other patient management decisions. A negative result may occur with  improper specimen collection/handling, submission of specimen other than nasopharyngeal swab, presence of viral mutation(s) within the areas targeted by this assay, and inadequate number of viral copies(<138 copies/mL). A negative result must be combined with clinical observations, patient history, and epidemiological information. The expected result is Negative.  Fact Sheet for Patients:  BloggerCourse.com  Fact Sheet for Healthcare Providers:  SeriousBroker.it  This test is no t yet approved or cleared by the [place] FDA and  has been authorized for detection and/or diagnosis of SARS-CoV-2 by FDA under an Emergency Use Authorization (EUA). This EUA will remain  in effect (meaning this test can be used) for the duration of the COVID-19 declaration under Section 564(b)(1) of the Act, 21 U.S.C.section 360bbb-3(b)(1), unless the authorization is terminated  or revoked sooner.       Influenza A by PCR NEGATIVE NEGATIVE Final   Influenza B by PCR NEGATIVE NEGATIVE Final    Comment: (NOTE) The Xpert Xpress SARS-CoV-2/FLU/RSV plus assay is intended as an aid in the diagnosis of influenza from Nasopharyngeal swab specimens and should not be used as a sole basis for treatment. Nasal washings and aspirates are unacceptable for Xpert Xpress SARS-CoV-2/FLU/RSV testing.  Fact Sheet for Patients: BloggerCourse.com  Fact Sheet for Healthcare Providers: SeriousBroker.it  This test is not yet approved or cleared by the [place] FDA and has been authorized for detection and/or diagnosis of SARS-CoV-2  by FDA under an Emergency Use Authorization (EUA). This EUA will remain in effect (meaning this test can be used) for the duration of the COVID-19 declaration under Section 564(b)(1) of the Act, 21 U.S.C. section 360bbb-3(b)(1), unless the authorization is terminated or revoked.  Performed at [facility], [address] [place], [place] [address]       Radiology Studies: CT CHEST WO CONTRAST  Result Date: 08/07/2021 CLINICAL DATA:  Shortness of breath and left-sided chest pain. EXAM: CT CHEST WITHOUT CONTRAST TECHNIQUE: Multidetector CT imaging of the chest was performed following the standard protocol without IV contrast. COMPARISON:  Chest radiograph dated 08/06/2021 and CT chest dated 08/03/2021. FINDINGS: Cardiovascular: No significant vascular findings. Normal heart size. No pericardial effusion. Mediastinum/Nodes: No enlarged mediastinal or axillary lymph nodes. Thyroid gland, trachea, and esophagus demonstrate no significant findings. Lungs/Pleura: There are large left and small right pleural effusions with associated atelectasis, new since 08/03/2021. Moderate ground-glass opacities are seen in both upper lobes with areas of consolidation in the left lower and left upper lobes, new since 08/03/2021. There is no pneumothorax. Upper Abdomen: No acute abnormality. Musculoskeletal: No chest wall mass or suspicious bone lesions identified. IMPRESSION: 1. Large left and small right pleural effusions with associated atelectasis. 2. Moderate ground-glass opacities in both lungs may represent pneumonia. Electronically Signed   By: Romona Curls M.D.   On: 08/07/2021 10:24   [place] CHEST (PLEURAL EFFUSION)  Result Date: 08/09/2021 CLINICAL DATA:  Evaluate pleural fluid for thoracentesis EXAM: CHEST ULTRASOUND COMPARISON:  CT chest October 23 FINDINGS: Focused sonographic exam of the bilateral chest was performed. Submitted images demonstrate trace pleural fluid bilaterally. There is  insufficient fluid to perform safe image guided thoracentesis. IMPRESSION: Focused sonographic exam of the bilateral chest demonstrates trace bilateral pleural effusions. Fluid volume is insufficient for safe image guided thoracentesis. When this study is used in conjunction with recent CT chest, it is felt like findings in the chest likely represent more consolidated lung, and less pleural fluid. Electronically Signed   By: Olive Bass M.D.   On: 08/09/2021 07:44   US Venous Img Upper Bilat (DVT)  Result Date: 08/07/2021 CLINICAL DATA:  50 year old male with history of pulmonary embolism. EXAM: BILATERAL UPPER EXTREMITY VENOUS DOPPLER ULTRASOUND TECHNIQUE: Gray-scale sonography with graded compression, as well as color Doppler and duplex ultrasound were performed to evaluate the bilateral upper extremity deep venous systems from the level of the subclavian vein and including the jugular, axillary, basilic, radial, ulnar and upper cephalic vein. Spectral Doppler was utilized to evaluate flow at rest and with distal augmentation maneuvers. COMPARISON:  None. FINDINGS: RIGHT UPPER EXTREMITY Internal Jugular Vein: No evidence of thrombus. Normal  compressibility, respiratory phasicity and response to augmentation. Subclavian Vein: No evidence of thrombus. Normal compressibility, respiratory phasicity and response to augmentation. Axillary Vein: No evidence of thrombus. Normal compressibility, respiratory phasicity and response to augmentation. Cephalic Vein: No evidence of thrombus. Normal compressibility, respiratory phasicity and response to augmentation. Basilic Vein: No evidence of thrombus. Normal compressibility, respiratory phasicity and response to augmentation. Brachial Veins: No evidence of thrombus. Normal compressibility, respiratory phasicity and response to augmentation. Radial Veins: No evidence of thrombus. Normal compressibility, respiratory phasicity and response to augmentation. Ulnar Veins:  No evidence of thrombus. Normal compressibility, respiratory phasicity and response to augmentation. Other Findings:  None. LEFT UPPER EXTREMITY Internal Jugular Vein: No evidence of thrombus. Normal compressibility, respiratory phasicity and response to augmentation. Subclavian Vein: No evidence of thrombus. Normal compressibility, respiratory phasicity and response to augmentation. Axillary Vein: No evidence of thrombus. Normal compressibility, respiratory phasicity and response to augmentation. Cephalic Vein: No evidence of thrombus. Normal compressibility, respiratory phasicity and response to augmentation. Basilic Vein: No evidence of thrombus. Normal compressibility, respiratory phasicity and response to augmentation. Brachial Veins: No evidence of thrombus. Normal compressibility, respiratory phasicity and response to augmentation. Radial Veins: No evidence of thrombus. Normal compressibility, respiratory phasicity and response to augmentation. Ulnar Veins: No evidence of thrombus. Normal compressibility, respiratory phasicity and response to augmentation. Other Findings:  None. IMPRESSION: No evidence of deep vein thrombosis within either upper extremity. Marliss Coots, MD [facility] Electronically Signed   By: Marliss Coots M.D.   On: 08/07/2021 10:50    Scheduled Meds:  apixaban  10 mg Oral BID   Followed by   Melene Muller ON 08/11/2021] apixaban  5 mg Oral BID   ferrous fumarate-b12-vitamic C-folic acid  1 capsule Oral TID PC   insulin aspart  0-15 Units Subcutaneous TID WC   insulin aspart  0-5 Units Subcutaneous QHS   insulin aspart  8 Units Subcutaneous TID WC   insulin glargine-yfgn  30 Units Subcutaneous BID   ipratropium-albuterol  3 mL Nebulization Q4H   lisinopril  10 mg Oral Daily   potassium chloride  40 mEq Oral Q4H   sertraline  100 mg Oral Daily   Continuous Infusions:  sodium chloride 75 mL/hr at 08/08/21 2351    cefTRIAXone (ROCEPHIN)  IV 2 g (08/08/21 1230)     LOS: 4 days   Time spent: 42 minutes. More than 50% of the time was spent in counseling/coordination of care  Arnetha Courser, MD [facility]  If 7PM-7AM, please contact night-coverage [email or web address]  08/09/2021, 8:22 AM   This record has been created using Conservation officer, historic buildings. Errors have been sought and corrected,but may not always be located. Such creation errors do not reflect on the standard of care.

## 2021-08-09 NOTE — Op Note (Signed)
[facility]  Percutaneous Study/Intervention Procedural Note   Date of Surgery: 08/09/2021,4:06 PM  Surgeon:Zymire Turnbo, Latina Craver   Pre-operative Diagnosis: Symptomatic pulmonary emboli with hypoxia  Post-operative diagnosis:  Same  Procedure(s) Performed:  1.  Selective injection left subsegmental pulmonary arteries; left lower lobe pulmonary arteries  2.  Ultrasound-guided access to the right common femoral vein   Anesthesia: Conscious sedation was administered under my direct supervision by the interventional radiology RN. IV Versed plus fentanyl were utilized. Continuous ECG, pulse oximetry and blood pressure was monitored throughout the entire procedure.  Conscious sedation was administered for a total of 25 minutes.  Sheath: 8 French 11 cm Pinnacle sheath antegrade right common femoral vein  Contrast: 25 cc   Fluoroscopy Time: 1.3 minutes  Indications:  Patient presented to the hospital with hypoxia and shortness of breath. The patient is unable to get out of bed and transfer to a chair without increased dyspnea. The patient's O2 saturations off nasal cannula are in the 80%. CT angiogram demonstrated pulmonary emboli in the left lower lobe.  Given the long-term sequela and the patient's symptomatic condition the risks and benefits for angiography with thrombectomy are reviewed all questions are answered, the patient agrees to proceed.  Procedure:  Hamilton Marinello a 50 y.o. male who was identified and appropriate procedural time out was performed.  The patient was then placed supine on the table and prepped and draped in the usual sterile fashion.  Ultrasound was used to evaluate the right common femoral vein.  It was compressible indicating it is patent .  A digital ultrasound image was acquired for the permanent record.  A micropuncture needle was used to access the right common femoral vein under direct ultrasound guidance.  A microwire was then advanced under  fluoroscopic guidance followed by micro-sheath.  A 0.035 J wire was advanced without resistance and a 5Fr sheath was placed.     The J-wire and pigtail catheter was advanced up to the right atrium where a bolus injection contrast was used to demonstrate the pulmonary artery outflow.  Stiff angled Glidewire was then exchanged for the J-wire and the pigtail catheter was exchanged for JR4 catheter. Using the combination of the stiff angled Glidewire and the JR4 catheter the left lower lobe pulmonary outflow track was selected.  The left lower lobe pulmonary artery was evaluated first.  Catheter was then repositioned more distally in the left lower lobe and a follow-up image was obtained.  No significant pulmonary thrombus was identified in the left pulmonary artery, segmental branches or subsegmental branches.   After review these images the catheter and sheath were removed and pressure held. There were no immediate complications.  Findings:   Left pulmonary: No flow-limiting thrombus was identified within the left pulmonary artery, segmental or subsegmental branches.  No thrombectomy was indicated.    Disposition: Patient was taken to the recovery room in stable condition having tolerated the procedure well.  Earl Lites Cynde Menard 08/09/2021,4:06 PM

## 2021-08-10 ENCOUNTER — Encounter: Payer: Self-pay | Admitting: Vascular Surgery

## 2021-08-10 ENCOUNTER — Inpatient Hospital Stay: Payer: 59

## 2021-08-10 DIAGNOSIS — F418 Other specified anxiety disorders: Secondary | ICD-10-CM | POA: Diagnosis not present

## 2021-08-10 DIAGNOSIS — I2699 Other pulmonary embolism without acute cor pulmonale: Secondary | ICD-10-CM | POA: Diagnosis not present

## 2021-08-10 DIAGNOSIS — E119 Type 2 diabetes mellitus without complications: Secondary | ICD-10-CM | POA: Diagnosis not present

## 2021-08-10 DIAGNOSIS — I10 Essential (primary) hypertension: Secondary | ICD-10-CM | POA: Diagnosis not present

## 2021-08-10 LAB — CBC
HCT: 24.1 % — ABNORMAL LOW (ref 39.0–52.0)
Hemoglobin: 8.5 g/dL — ABNORMAL LOW (ref 13.0–17.0)
MCH: 24.4 pg — ABNORMAL LOW (ref 26.0–34.0)
MCHC: 35.3 g/dL (ref 30.0–36.0)
MCV: 69.3 fL — ABNORMAL LOW (ref 80.0–100.0)
Platelets: 214 10*3/uL (ref 150–400)
RBC: 3.48 MIL/uL — ABNORMAL LOW (ref 4.22–5.81)
RDW: 16.6 % — ABNORMAL HIGH (ref 11.5–15.5)
WBC: 11.3 10*3/uL — ABNORMAL HIGH (ref 4.0–10.5)
nRBC: 0 % (ref 0.0–0.2)

## 2021-08-10 LAB — BASIC METABOLIC PANEL
Anion gap: 7 (ref 5–15)
BUN: 8 mg/dL (ref 6–20)
CO2: 28 mmol/L (ref 22–32)
Calcium: 8.7 mg/dL — ABNORMAL LOW (ref 8.9–10.3)
Chloride: 104 mmol/L (ref 98–111)
Creatinine, Ser: 0.66 mg/dL (ref 0.61–1.24)
GFR, Estimated: >60 mL/min (ref >60)
Glucose, Bld: 131 mg/dL — ABNORMAL HIGH (ref 70–99)
Potassium: 3.4 mmol/L — ABNORMAL LOW (ref 3.5–5.1)
Sodium: 139 mmol/L (ref 135–145)

## 2021-08-10 LAB — PROCALCITONIN: Procalcitonin: 0.15 ng/mL

## 2021-08-10 LAB — GLUCOSE, CAPILLARY
Glucose-Capillary: 104 mg/dL — ABNORMAL HIGH (ref 70–99)
Glucose-Capillary: 178 mg/dL — ABNORMAL HIGH (ref 70–99)
Glucose-Capillary: 111 mg/dL — ABNORMAL HIGH (ref 70–99)

## 2021-08-10 MED ORDER — KETOROLAC TROMETHAMINE 15 MG/ML IJ SOLN
15.0000 mg | Freq: Four times a day (QID) | INTRAMUSCULAR | Status: DC
  Administered 2021-08-10 – 2021-08-12 (×7): 15 mg via INTRAVENOUS
  Filled 2021-08-10 (×7): qty 1

## 2021-08-10 MED ORDER — POTASSIUM CHLORIDE CRYS ER 20 MEQ PO TBCR
40.0000 meq | EXTENDED_RELEASE_TABLET | Freq: Once | ORAL | Status: AC
  Administered 2021-08-10: 40 meq via ORAL
  Filled 2021-08-10: qty 2

## 2021-08-10 MED ORDER — HYDROCOD POLST-CPM POLST ER 10-8 MG/5ML PO SUER
5.0000 mL | Freq: Two times a day (BID) | ORAL | Status: DC | PRN
  Administered 2021-08-10 – 2021-08-11 (×2): 5 mL via ORAL
  Filled 2021-08-10 (×2): qty 5

## 2021-08-10 NOTE — Progress Notes (Signed)
Pulmonary Medicine          Date: 08/10/2021,   MRN# [id number] William Lin July 17, 1971     AdmissionWeight: 99.8 kg                 CurrentWeight: 99.8 kg   Reffering physician: Dr Reesa Chew    CHIEF COMPLAINT:   Acute hypoxemic respiratory failure   HISTORY OF PRESENT ILLNESS   This is a 50 yo M with hx of DM, MDD, anxiety , pleural effusions, chronic anemia of unclear diagnosis possible genetic came in with complaints of chest discomfort which was severe and with left arm radiation. He denies URI symptoms, sick contacts , NVD, admits to cross country travel to [place] and [place].  He had CT angio done showing Left sided PE's no RH strain. His H/h started to trend down and patient developed pleural effusion. PCCM for further evaluation and management. Hes on 3-4L/min [place] for supplemental O2.  He is eating and going to bathroom ok.  I met with sister William Lin of patient and spoke to wife William Lin on phone.   08/08/21- patient is slightly improved, he is on 3L/min [place] and reports dull chest pain over left hemithorax appx 4/10 at worst. He is able to speak in full sentences in non labored breathing pattern. He is scheduled for thoracenteis today at 1pm.   08/09/21- patient remains on 3L/min [place]. Wife at bedside. They have discussed with vascular team and additional intervention is planned.   08/10/21- patient continues to improve slowly.  He continues to have chest pain with 4/10 level. He had surgical evaluation. We reviewed CT chest and interval CXR today.    PAST MEDICAL HISTORY   Past Medical History:  Diagnosis Date  . Anxiety   . Diabetes mellitus without complication ([place])   . HTN (hypertension)   . Sickle cell anemia ([facility])      SURGICAL HISTORY   Past Surgical History:  Procedure Laterality Date  . CHOLECYSTECTOMY    . DENTAL SURGERY    . polynodial cyst    . ROTATOR CUFF REPAIR     Left     FAMILY HISTORY   Family History  Problem Relation Age of  Onset  . Hypertension Other   . Hypertension Mother   . Arthritis Mother      SOCIAL HISTORY   Social History   Tobacco Use  . Smoking status: Never  . Smokeless tobacco: Never  Substance Use Topics  . Alcohol use: No  . Drug use: Never     MEDICATIONS    Home Medication:    Current Medication:  Current Facility-Administered Medications:  .  acetaminophen (TYLENOL) tablet 650 mg, 650 mg, Oral, Q6H PRN, Schnier, Dolores Lory, MD, 650 mg at 08/08/21 0902 .  albuterol (VENTOLIN HFA) 108 (90 Base) MCG/ACT inhaler 2 puff, 2 puff, Inhalation, Q4H PRN, Schnier, Dolores Lory, MD .  apixaban (ELIQUIS) tablet 10 mg, 10 mg, Oral, BID, 10 mg at 08/10/21 0819 **FOLLOWED BY** [START ON 08/11/2021] apixaban (ELIQUIS) tablet 5 mg, 5 mg, Oral, BID, Schnier, Dolores Lory, MD .  [COMPLETED] ceFAZolin (ANCEF) IVPB 2g/100 mL premix, 2 g, Intravenous, Once, Stegmayer, Kimberly A, PA-C, Last Rate: 200 mL/hr at 08/10/21 0916, 2 g at 08/10/21 0916 .  dextromethorphan-guaiFENesin (MUCINEX DM) 30-600 MG per 12 hr tablet 1 tablet, 1 tablet, Oral, BID PRN, Schnier, Dolores Lory, MD, 1 tablet at 08/10/21 [phone]  dextrose 50 % solution 50 mL, 50 mL, Intravenous, Once,  Schnier, Dolores Lory, MD .  ferrous [id number] C-folic acid (TRINSICON / FOLTRIN) capsule 1 capsule, 1 capsule, Oral, TID PC, Schnier, Dolores Lory, MD, 1 capsule at 08/10/21 0818 .  hydrALAZINE (APRESOLINE) injection 5 mg, 5 mg, Intravenous, Q2H PRN, Schnier, Dolores Lory, MD .  HYDROmorphone (DILAUDID) injection 1 mg, 1 mg, Intravenous, Q4H PRN, Schnier, Dolores Lory, MD, 1 mg at 08/10/21 0005 .  HYDROmorphone (DILAUDID) injection 1 mg, 1 mg, Intravenous, Once PRN, Schnier, Dolores Lory, MD .  insulin aspart (novoLOG) injection 0-15 Units, 0-15 Units, Subcutaneous, TID WC, Schnier, Dolores Lory, MD, 2 Units at 08/09/21 1156 .  insulin aspart (novoLOG) injection 0-5 Units, 0-5 Units, Subcutaneous, QHS, Schnier, Dolores Lory, MD, 2 Units at 08/09/21 2142 .  insulin  aspart (novoLOG) injection 8 Units, 8 Units, Subcutaneous, TID WC, Schnier, Dolores Lory, MD, 8 Units at 08/10/21 0818 .  insulin glargine-yfgn (SEMGLEE) injection 30 Units, 30 Units, Subcutaneous, BID, Schnier, Dolores Lory, MD, 30 Units at 08/10/21 0820 .  ipratropium-albuterol (DUONEB) 0.5-2.5 (3) MG/3ML nebulizer solution 3 mL, 3 mL, Nebulization, Q6H, Schnier, Dolores Lory, MD, 3 mL at 08/10/21 0820 .  lisinopril (ZESTRIL) tablet 10 mg, 10 mg, Oral, Daily, Schnier, Dolores Lory, MD, 10 mg at 08/10/21 0819 .  nitroGLYCERIN (NITROSTAT) SL tablet 0.4 mg, 0.4 mg, Sublingual, Q5 min PRN, Schnier, Dolores Lory, MD, 0.4 mg at 08/05/21 0852 .  ondansetron (ZOFRAN) injection 4 mg, 4 mg, Intravenous, Q8H PRN, Schnier, Dolores Lory, MD, 4 mg at 08/05/21 1641 .  ondansetron (ZOFRAN) injection 4 mg, 4 mg, Intravenous, Q6H PRN, Schnier, Dolores Lory, MD .  oxyCODONE-acetaminophen (PERCOCET/ROXICET) 5-325 MG per tablet 1 tablet, 1 tablet, Oral, Q4H PRN, Schnier, Dolores Lory, MD, 1 tablet at 08/10/21 [phone]  sertraline (ZOLOFT) tablet 100 mg, 100 mg, Oral, Daily, Schnier, Dolores Lory, MD, 100 mg at 08/10/21 8341    ALLERGIES   Aspirin and Sulfonamide derivatives     REVIEW OF SYSTEMS    Review of Systems:  Gen:  Denies  fever, sweats, chills weigh loss  HEENT: Denies blurred vision, double vision, ear pain, eye pain, hearing loss, nose bleeds, sore throat Cardiac:  No dizziness, chest pain or heaviness, chest tightness,edema Resp:   Denies cough or sputum porduction, shortness of breath,wheezing, hemoptysis,  Gi: Denies swallowing difficulty, stomach pain, nausea or vomiting, diarrhea, constipation, bowel incontinence Gu:  Denies bladder incontinence, burning urine Ext:   Denies Joint pain, stiffness or swelling Skin: Denies  skin rash, easy bruising or bleeding or hives Endoc:  Denies polyuria, polydipsia , polyphagia or weight change Psych:   Denies depression, insomnia or hallucinations   Other:  All other systems  negative   VS: BP 134/79 (BP Location: Left Arm)   Pulse (!) 101   Temp 98.7 F (37.1 C)   Resp 20   Ht _0  (1.778 m)   Wt 99.8 kg   SpO2 98%   BMI 31.57 kg/m      PHYSICAL EXAM    GENERAL:NAD, no fevers, chills, no weakness no fatigue HEAD: Normocephalic, atraumatic.  EYES: Pupils equal, round, reactive to light. Extraocular muscles intact. No scleral icterus.  MOUTH: Moist mucosal membrane. Dentition intact. No abscess noted.  EAR, NOSE, THROAT: Clear without exudates. No external lesions.  NECK: Supple. No thyromegaly. No nodules. No JVD.  PULMONARY: bilateral rhonchi worse at left base  CARDIOVASCULAR: S1 and S2. Regular rate and rhythm. No murmurs, rubs, or gallops. No edema. Pedal pulses 2+ bilaterally.  GASTROINTESTINAL: Soft, nontender,  nondistended. No masses. Positive bowel sounds. No hepatosplenomegaly.  MUSCULOSKELETAL: No swelling, clubbing, or edema. Range of motion full in all extremities.  NEUROLOGIC: Cranial nerves II through XII are intact. No gross focal neurological deficits. Sensation intact. Reflexes intact.  SKIN: No ulceration, lesions, rashes, or cyanosis. Skin warm and dry. Turgor intact.  PSYCHIATRIC: Mood, affect within normal limits. The patient is awake, alert and oriented x 3. Insight, judgment intact.       IMAGING    DG Chest 2 View  Result Date: 08/06/2021 CLINICAL DATA:  50 year old male with shortness of breath, pulmonary embolism. EXAM: CHEST - 2 VIEW COMPARISON:  08/05/2021, 08/03/2021 FINDINGS: The mediastinal contours are partially obscured, unchanged. No cardiomegaly. Similar elevation left hemidiaphragm with left basilar consolidative opacity in obscuration of the left costophrenic angle. Subsegmental atelectasis in the right lung base. No acute osseous abnormality. IMPRESSION: Similar appearing left basilar opacity. Differential continues to include infarction, effusion, or mucous plugging. Repeat chest CT may be more  illustratrative as clinically appropriate. Electronically Signed   By: Ruthann Cancer M.D.   On: 08/06/2021 15:32   CT CHEST WO CONTRAST  Result Date: 08/07/2021 CLINICAL DATA:  Shortness of breath and left-sided chest pain. EXAM: CT CHEST WITHOUT CONTRAST TECHNIQUE: Multidetector CT imaging of the chest was performed following the standard protocol without IV contrast. COMPARISON:  Chest radiograph dated 08/06/2021 and CT chest dated 08/03/2021. FINDINGS: Cardiovascular: No significant vascular findings. Normal heart size. No pericardial effusion. Mediastinum/Nodes: No enlarged mediastinal or axillary lymph nodes. Thyroid gland, trachea, and esophagus demonstrate no significant findings. Lungs/Pleura: There are large left and small right pleural effusions with associated atelectasis, new since 08/03/2021. Moderate ground-glass opacities are seen in both upper lobes with areas of consolidation in the left lower and left upper lobes, new since 08/03/2021. There is no pneumothorax. Upper Abdomen: No acute abnormality. Musculoskeletal: No chest wall mass or suspicious bone lesions identified. IMPRESSION: 1. Large left and small right pleural effusions with associated atelectasis. 2. Moderate ground-glass opacities in both lungs may represent pneumonia. Electronically Signed   By: Zerita Boers M.D.   On: 08/07/2021 10:24   CT Angio Chest PE W/Cm &/Or Wo Cm  Result Date: 08/03/2021 CLINICAL DATA:  Evaluate for pulmonary embolus EXAM: CT ANGIOGRAPHY CHEST WITH CONTRAST TECHNIQUE: Multidetector CT imaging of the chest was performed using the standard protocol during bolus administration of intravenous contrast. Multiplanar CT image reconstructions and MIPs were obtained to evaluate the vascular anatomy. CONTRAST:  22m OMNIPAQUE IOHEXOL 350 MG/ML SOLN COMPARISON:  Chest CT dated July 25, 2011 FINDINGS: Cardiovascular: Adequate opacification of the pulmonary arteries. Intraluminal filling defect of segmental and  subsegmental left lower lobe pulmonary arteries. Normal heart size with no CT evidence of right heart strain. No pericardial effusion. No significant coronary artery calcifications or atherosclerotic disease of the thoracic aorta. Mediastinum/Nodes: Esophagus and thyroid are unremarkable. No pathologically enlarged lymph nodes seen in the chest. Lungs/Pleura: Central airways are patent. Left lower lobe linear opacities, likely due to scarring or atelectasis. No consolidation, pleural effusion or pneumothorax. Upper Abdomen: No acute abnormality. Musculoskeletal: No chest wall abnormality. No acute or significant osseous findings. Review of the MIP images confirms the above findings. IMPRESSION: Pulmonary embolus of segmental and subsegmental left lower lobe pulmonary arteries. No CT evidence of right heart strain. Critical Value/emergent results were called by telephone at the time of interpretation on 08/03/2021 at 1:58 pm to provider IEllender Hose MD, who verbally acknowledged these results. Electronically Signed   By: LDenny Peon  Strickland M.D.   On: 08/03/2021 13:59   [place] CHEST (PLEURAL EFFUSION)  Result Date: 08/09/2021 CLINICAL DATA:  Evaluate pleural fluid for thoracentesis EXAM: CHEST ULTRASOUND COMPARISON:  CT chest October 23 FINDINGS: Focused sonographic exam of the bilateral chest was performed. Submitted images demonstrate trace pleural fluid bilaterally. There is insufficient fluid to perform safe image guided thoracentesis. IMPRESSION: Focused sonographic exam of the bilateral chest demonstrates trace bilateral pleural effusions. Fluid volume is insufficient for safe image guided thoracentesis. When this study is used in conjunction with recent CT chest, it is felt like findings in the chest likely represent more consolidated lung, and less pleural fluid. Electronically Signed   By: Albin Felling M.D.   On: 08/09/2021 07:44   PERIPHERAL VASCULAR CATHETERIZATION  Result Date: 08/09/2021 See surgical  note for result.  US Venous Img Lower Bilateral (DVT)  Result Date: 08/03/2021 CLINICAL DATA:  Pulmonary embolism. EXAM: BILATERAL LOWER EXTREMITY VENOUS DOPPLER ULTRASOUND TECHNIQUE: Gray-scale sonography with graded compression, as well as color Doppler and duplex ultrasound were performed to evaluate the lower extremity deep venous systems from the level of the common femoral vein and including the common femoral, femoral, profunda femoral, popliteal and calf veins including the posterior tibial, peroneal and gastrocnemius veins when visible. The superficial great saphenous vein was also interrogated. Spectral Doppler was utilized to evaluate flow at rest and with distal augmentation maneuvers in the common femoral, femoral and popliteal veins. COMPARISON:  None. FINDINGS: RIGHT LOWER EXTREMITY Common Femoral Vein: No evidence of thrombus. Normal compressibility, respiratory phasicity and response to augmentation. Saphenofemoral Junction: No evidence of thrombus. Normal compressibility and flow on color Doppler imaging. Profunda Femoral Vein: No evidence of thrombus. Normal compressibility and flow on color Doppler imaging. Femoral Vein: No evidence of thrombus. Normal compressibility, respiratory phasicity and response to augmentation. Popliteal Vein: No evidence of thrombus. Normal compressibility, respiratory phasicity and response to augmentation. Calf Veins: No evidence of thrombus. Normal compressibility and flow on color Doppler imaging. Superficial Great Saphenous Vein: No evidence of thrombus. Normal compressibility. Venous Reflux:  None. Other Findings: No evidence of superficial thrombophlebitis or abnormal fluid collection. LEFT LOWER EXTREMITY Common Femoral Vein: No evidence of thrombus. Normal compressibility, respiratory phasicity and response to augmentation. Saphenofemoral Junction: No evidence of thrombus. Normal compressibility and flow on color Doppler imaging. Profunda Femoral Vein: No  evidence of thrombus. Normal compressibility and flow on color Doppler imaging. Femoral Vein: No evidence of thrombus. Normal compressibility, respiratory phasicity and response to augmentation. Popliteal Vein: No evidence of thrombus. Normal compressibility, respiratory phasicity and response to augmentation. Calf Veins: No evidence of thrombus. Normal compressibility and flow on color Doppler imaging. Superficial Great Saphenous Vein: No evidence of thrombus. Normal compressibility. Venous Reflux:  None. Other Findings: There is thrombus noted in the left short saphenous vein behind the knee and extending into the calf. This thrombus does not visibly extend into the left popliteal vein. IMPRESSION: No evidence of deep venous thrombosis in either lower extremity. Superficial thrombophlebitis noted in the left short saphenous vein. Electronically Signed   By: Aletta Edouard M.D.   On: 08/03/2021 16:59   US Venous Img Upper Bilat (DVT)  Result Date: 08/07/2021 CLINICAL DATA:  50 year old male with history of pulmonary embolism. EXAM: BILATERAL UPPER EXTREMITY VENOUS DOPPLER ULTRASOUND TECHNIQUE: Gray-scale sonography with graded compression, as well as color Doppler and duplex ultrasound were performed to evaluate the bilateral upper extremity deep venous systems from the level of the subclavian vein and including the jugular, axillary,  basilic, radial, ulnar and upper cephalic vein. Spectral Doppler was utilized to evaluate flow at rest and with distal augmentation maneuvers. COMPARISON:  None. FINDINGS: RIGHT UPPER EXTREMITY Internal Jugular Vein: No evidence of thrombus. Normal compressibility, respiratory phasicity and response to augmentation. Subclavian Vein: No evidence of thrombus. Normal compressibility, respiratory phasicity and response to augmentation. Axillary Vein: No evidence of thrombus. Normal compressibility, respiratory phasicity and response to augmentation. Cephalic Vein: No evidence of  thrombus. Normal compressibility, respiratory phasicity and response to augmentation. Basilic Vein: No evidence of thrombus. Normal compressibility, respiratory phasicity and response to augmentation. Brachial Veins: No evidence of thrombus. Normal compressibility, respiratory phasicity and response to augmentation. Radial Veins: No evidence of thrombus. Normal compressibility, respiratory phasicity and response to augmentation. Ulnar Veins: No evidence of thrombus. Normal compressibility, respiratory phasicity and response to augmentation. Other Findings:  None. LEFT UPPER EXTREMITY Internal Jugular Vein: No evidence of thrombus. Normal compressibility, respiratory phasicity and response to augmentation. Subclavian Vein: No evidence of thrombus. Normal compressibility, respiratory phasicity and response to augmentation. Axillary Vein: No evidence of thrombus. Normal compressibility, respiratory phasicity and response to augmentation. Cephalic Vein: No evidence of thrombus. Normal compressibility, respiratory phasicity and response to augmentation. Basilic Vein: No evidence of thrombus. Normal compressibility, respiratory phasicity and response to augmentation. Brachial Veins: No evidence of thrombus. Normal compressibility, respiratory phasicity and response to augmentation. Radial Veins: No evidence of thrombus. Normal compressibility, respiratory phasicity and response to augmentation. Ulnar Veins: No evidence of thrombus. Normal compressibility, respiratory phasicity and response to augmentation. Other Findings:  None. IMPRESSION: No evidence of deep vein thrombosis within either upper extremity. Ruthann Cancer, MD [facility] Electronically Signed   By: Ruthann Cancer M.D.   On: 08/07/2021 10:50   DG Chest Port 1 View  Result Date: 08/05/2021 CLINICAL DATA:  50 year old male shortness of breath. Chest pain and diaphoresis. Positive left pulmonary  embolus on recent CTA. EXAM: PORTABLE CHEST 1 VIEW COMPARISON:  CTA chest 08/03/2021 and earlier. FINDINGS: Portable AP upright view at 0905 hours. Lower lung volumes with increasing dense opacity at the left lung base most resembling pleural effusion. No pulmonary edema or pneumothorax. Visible mediastinal contours remain stable. Patchy increased right lung opacity more resembling atelectasis. No acute osseous abnormality identified. Negative visible bowel gas. IMPRESSION: Lower lung volumes with increasing dense opacification at the left lung base. Favor acute pleural effusion, although pulmonary infarct not excluded in this setting. Increased right lung base atelectasis. Electronically Signed   By: Genevie Ann M.D.   On: 08/05/2021 09:37          ASSESSMENT/PLAN     Acute on chronic hypoxemic respiratory failure  -S/p PE and with pleural effusion    -have discused with patient and family   - patient is on eliquis    Will check fluid studies including culture to evaluate for hemothorax vs empyema  - patient scheduled for thoracentesis today    Atelectasis of left lung  - Bronchopulmonary hygiene  -repeat chest imaging with less fluid and more atelectasis   Left lung bronchopneumonia - on zithromax and rocephin   Acute PE - on eliquis  - vascular surgery is on case plan for procedure today  Sickle Cell disease SF/SHP - patient has not had any treatment and has never been hospitalized for sickle cell disease     Thank you for allowing me to participate in the care of this patient.    Patient/Family are satisfied with care plan  and all questions have been answered.   This document was prepared using Dragon voice recognition software and may include unintentional dictation errors.     Ottie Glazier, M.D.  Division of [place]

## 2021-08-10 NOTE — Progress Notes (Signed)
Physical Therapy Treatment Patient Details Name: William Lin MRN: [id number] DOB: 05-Jun-1971 Today's Date: 08/10/2021   History of Present Illness Pt is a 50 y/o M with PMH: HTN, DM, depression, anxiety,  and questionable h/o  sickle cell anemia. He presented to ED d/t c/o chest pain. Troponin negative and EKG wtih no significant ST change. Pt found to have pulmonary embolism on CTA of chest (recent h/o long flight). Pt initailly on heparin and now transitioned to Eliquis at time of therapy assessment.    PT Comments    Pt supine in bed, visitors in room and RT finishing session. RT notified PT pt had just been taken off O2 and to monitor with activity. Pt agreeable to therapy. He was Mod I with bed mobility, SUP to STS and CGA progressing to SUP with ambulation. Ambulation distance doubled compared to previous session. Continuous monitoring of SpO2 with mobility. O2 did drop to 88% during bed mobility. Once sitting upright, pt SpO2 remained 90% or greater during all standing activity including ambulation. Pt excited to hear that he was progressing in the right direction. He asked about recovery time regarding weakness and deconditioning. PT educated on time frame and progression. He also asked questions regarding return to work. PT advised pt to speak to MD. He would continue to benefit from skilled interventions to address gait deficiencies and improve overall conditioning/endurance.    Recommendations for follow up therapy are one component of a multi-disciplinary discharge planning process, led by the attending physician.  Recommendations may be updated based on patient status, additional functional criteria and insurance authorization.  Follow Up Recommendations  Home health PT     Assistance Recommended at Discharge PRN  Equipment Recommendations  None recommended by PT    Recommendations for Other Services       Precautions / Restrictions Precautions Precautions:  None Restrictions Weight Bearing Restrictions: No     Mobility  Bed Mobility Overal bed mobility: Modified Independent                  Transfers Overall transfer level: Needs assistance Equipment used: None Transfers: Sit to/from Stand Sit to Stand: Supervision           General transfer comment: using UE support from fully lowered EOB    Ambulation/Gait Ambulation/Gait assistance: Min guard;Supervision Gait Distance (Feet): 200 Feet Assistive device: None Gait Pattern/deviations: Step-through pattern;Decreased step length - right;Decreased step length - left;Decreased stride length Gait velocity: decreased   General Gait Details: very short, choppy steps; limited arm swing. Significantly decreased stride length and cadence. Toe out bilaterally, R>L.   Stairs             Wheelchair Mobility    Modified Rankin (Stroke Patients Only)       Balance Overall balance assessment: Needs assistance Sitting-balance support: Feet supported Sitting balance-Leahy Scale: Good     Standing balance support: No upper extremity supported Standing balance-Leahy Scale: Fair Standing balance comment: No obvious LOB however increased trunk sway noted and decreased foot clearance bilaterally during ambulation - educated to lift feet and be aware of items on the floor to avoid tripping.                            Cognition Arousal/Alertness: Awake/alert Behavior During Therapy: WFL for tasks assessed/performed Overall Cognitive Status: Within Functional Limits for tasks assessed  Exercises      General Comments        Pertinent Vitals/Pain Pain Assessment: 0-10 Pain Score: 3  Pain Location: L chest/shoulder; 2-3/10 Pain Descriptors / Indicators: Grimacing;Guarding    Home Living                          Prior Function            PT Goals (current goals can now be found  in the care plan section) Acute Rehab PT Goals Patient Stated Goal: to go home and return to work PT Goal Formulation: With patient Time For Goal Achievement: 08/20/21 Potential to Achieve Goals: Good    Frequency    Min 2X/week      PT Plan      Co-evaluation              AM-PAC PT "6 Clicks" Mobility   Outcome Measure  Help needed turning from your back to your side while in a flat bed without using bedrails?: None Help needed moving from lying on your back to sitting on the side of a flat bed without using bedrails?: None Help needed moving to and from a bed to a chair (including a wheelchair)?: None Help needed standing up from a chair using your arms (e.g., wheelchair or bedside chair)?: A Little Help needed to walk in hospital room?: A Little Help needed climbing 3-5 steps with a railing? : A Little 6 Click Score: 21    End of Session Equipment Utilized During Treatment: Gait belt Activity Tolerance: Patient tolerated treatment well Patient left: in bed;with call bell/phone within reach Nurse Communication: Mobility status PT Visit Diagnosis: Muscle weakness (generalized) (M62.81)     Time: 4627-0350 PT Time Calculation (min) (ACUTE ONLY): 26 min  Charges:  $Gait Training: 8-22 mins $Therapeutic Activity: 8-22 mins                      Basilia Jumbo PT, DPT 08/10/21 4:30 PM [phone]

## 2021-08-10 NOTE — Progress Notes (Signed)
PROGRESS NOTE    William Lin  UXL:[id number] DOB: Nov 16, 1970 DOA: 08/03/2021 PCP: [facility]    Brief Narrative:  William Lin is a 50 year old male with past medical history significant for essential hypertension, type 2 diabetes mellitus, depression/anxiety, sickle cell anemia (mild variance without previous crisis per patient), who presented to [facility] ED on 10/19 with chest pain.  Patient reports long distance travel recently, flew to [place] a week ago and then [place] a month ago.  He also reported discomfort in his left calf area which resolved roughly 1 week ago.  Patient was seen in urgent care and found to have a positive D-dimer and sent to the ED for further evaluation.  In the ED, the [facility] count 10.2, troponin 3, D-dimer 1.36, afebrile, BP 162/104, HR 106, RR 27, SPO2 96% on room air.  CT angiogram chest notable for pulmonary embolism, segmental and subsegmental left lower lobe pulmonary arteries, no CT evidence of right heart strain.  Patient was started on IV heparin drip.  Duration consulted for further evaluation management.   Assessment & Plan:   Principal Problem:   Acute pulmonary embolism (HCC) Active Problems:   Diabetes mellitus without complication (HCC)   Depression with anxiety   HTN (hypertension)   Pulmonary embolism (HCC)   Acute hypoxic respiratory failure, POA Acute pulmonary embolism Patient presenting to the ED after elevated D-dimer at urgent care.  Recent prolonged travel with several flights including to [place] in [place] over the last month.  Also reports recent left calf pain; that now has resolved.  CT angiogram chest notable for pulmonary embolism left lower lobe segmental and segment segmental pulmonary arteries.  Patient was initially started on heparin drip.  Pulmonology and vascular surgery were consulted and followed on hospital course.  Patient underwent injection of his left pulmonary arteries by Dr. Gilda Crease on 10/25  with findings of no flow-limiting thrombus within the left pulmonary artery, segmental or subsegmental branches and no thrombectomy was indicated. --PCCM continues to follow, appreciate assistance --Eliquis --DuoNebs every 6 hours --Albuterol MDI as needed --Toradol 15 mg IV every 6 hours x5 days --Percocet 5-325 mg p.o. q4h prn moderate pain --Continue supplemental oxygen, maintain SPO2 >92%, currently on 3 L nasal cannula  Atelectasis left lung --Continue incentive spirometry, flutter valve,  Left lung bronchopneumonia --WBC 15.1>16.2>12.9>12.1>11.3 --PCT 1.40>0.15 --Completed course of azithromycin and ceftriaxone  Type 2 diabetes mellitus At home on metformin 1000 mg p.o. twice daily, pioglitazone 15 mg p.o. daily.  Hemoglobin A1c 6.8 on 08/03/2021; controlled. --Semglee 30u Ronan BID --NovoLog 8 units 3 times daily AC --SSI for coverage --CBGs qAC/HS  Essential hypertension --Lisinopril 10 mg p.o. daily  Depression/anxiety: Sertraline 100 mg p.o. daily  Sickle cell disease Hemoglobin electrophoresis normal for pattern concentrations consistent with homozygous sickle cell disease with hemoglobin F increased suggestive of hereditary persistent fetal gene.  Reticulocyte count and LDH within normal limits. --LDH, retic count in the a.m. --CMP to evaluate bilirubin --monitor Hgb daily   DVT prophylaxis:  apixaban (ELIQUIS) tablet 10 mg  apixaban (ELIQUIS) tablet 5 mg    Code Status: Full Code Family Communication: Updated patient's spouse present at bedside this morning  Disposition Plan:  Level of care: Progressive Cardiac Status is: Inpatient  Remains inpatient appropriate because: Patient continues with chest pain, shortness of breath, cough and hypoxia requiring 3 L nasal cannula.  Pending further work-up per pulmonology.     Consultants:  PCCM Vascular surgery  Procedures:  Left pulmonary artery  injection, vascular surgery, Dr. Juleen Starr 10/25  Antimicrobials:   Cefazolin 10/26 - 10/26 Azithromycin 10/21 -10/23 Ceftriaxone 10/21 - 10/25   Subjective: Patient seen examined bedside, resting comfortably in bed.  Spouse present at bedside.  Continues to complain of chest pain, continued cough that is nonproductive.  Spouse concerned about possible "acute chest syndrome" or continued "pneumonia".  Continues to be followed by pulmonology.  Underwent left pulmonary artery injection yesterday with no findings of thrombus and no thrombectomy/thrombolysis indicated.  Continues on 3 L nasal cannula.  No other questions or concerns at this time.  Denies headache, no fever/chills/night sweats, no nausea/vomiting/diarrhea, no palpitations, no abdominal pain, no weakness, no fatigue, no paresthesias.  No acute events overnight per nurse staff.  Objective: Vitals:   08/10/21 0822 08/10/21 1233 08/10/21 1405 08/10/21 1539  BP:  120/78  (!) 149/94  Pulse: (!) 101 (!) 108 (!) 110 (!) 107  Resp: 20 18 16 18   Temp:  98.6 F (37 C)  98.4 F (36.9 C)  TempSrc:    Oral  SpO2: 98% 95% 94% 96%  Weight:      Height:        Intake/Output Summary (Last 24 hours) at 08/10/2021 1726 Last data filed at 08/10/2021 1540 Gross per 24 hour  Intake 960 ml  Output 2750 ml  Net -1790 ml   Filed Weights   08/03/21 1214  Weight: 99.8 kg    Examination:  General exam: Appears calm and comfortable slightly ill in appearance Respiratory system: Clear to auscultation. Respiratory effort normal.  On 3 L nasal cannula with SPO2 93% at rest Cardiovascular system: S1 & S2 heard, RRR. No JVD, murmurs, rubs, gallops or clicks. No pedal edema. Gastrointestinal system: Abdomen is nondistended, soft and nontender. No organomegaly or masses felt. Normal bowel sounds heard. Central nervous system: Alert and oriented. No focal neurological deficits. Extremities: Symmetric 5 x 5 power. Skin: No rashes, lesions or ulcers Psychiatry: Judgement and insight appear normal. Mood & affect  appropriate.     Data Reviewed: I have personally reviewed following labs and imaging studies  CBC: Recent Labs  Lab 08/06/21 0941 08/07/21 1052 08/08/21 0900 08/09/21 0447 08/10/21 0438  WBC 15.1* 16.2* 12.9* 12.1* 11.3*  NEUTROABS 12.0*  --  9.8*  --   --   HGB 10.1* 10.3* 9.7* 9.0* 8.5*  HCT 27.9* 28.0* 26.6* 25.2* 24.1*  MCV 71.2* 69.1* 69.5* 67.9* 69.3*  PLT 161 200 200 203 214   Basic Metabolic Panel: Recent Labs  Lab 08/05/21 0613 08/07/21 1052 08/09/21 0447 08/10/21 0438  NA 134* 136 139 139  K 3.8 3.2* 2.8* 3.4*  CL 100 96* 100 104  CO2 23 25 29 28   GLUCOSE 280* 279* 181* 131*  BUN 11 9 9 8   CREATININE 0.88 0.62 0.75 0.66  CALCIUM 9.3 9.2 9.1 8.7*  MG 1.6*  --  1.7  --   PHOS 3.7  --   --   --    GFR: Estimated Creatinine Clearance: 127.9 mL/min (by C-G formula based on SCr of 0.66 mg/dL). Liver Function Tests: No results for input(s): AST, ALT, ALKPHOS, BILITOT, PROT, ALBUMIN in the last 168 hours. No results for input(s): LIPASE, AMYLASE in the last 168 hours. No results for input(s): AMMONIA in the last 168 hours. Coagulation Profile: No results for input(s): INR, PROTIME in the last 168 hours. Cardiac Enzymes: No results for input(s): CKTOTAL, CKMB, CKMBINDEX, TROPONINI in the last 168 hours. BNP (last 3 results) No results  for input(s): PROBNP in the last 8760 hours. HbA1C: No results for input(s): HGBA1C in the last 72 hours. CBG: Recent Labs  Lab 08/09/21 1636 08/09/21 1802 08/09/21 2126 08/10/21 0806 08/10/21 1233  GLUCAP 144* 103* 226* 104* 178*   Lipid Profile: No results for input(s): CHOL, HDL, LDLCALC, TRIG, CHOLHDL, LDLDIRECT in the last 72 hours. Thyroid Function Tests: No results for input(s): TSH, T4TOTAL, FREET4, T3FREE, THYROIDAB in the last 72 hours. Anemia Panel: No results for input(s): VITAMINB12, FOLATE, FERRITIN, TIBC, IRON, RETICCTPCT in the last 72 hours. Sepsis Labs: Recent Labs  Lab 08/05/21 1440  08/10/21 1002  PROCALCITON 1.40 0.15    Recent Results (from the past 240 hour(s))  Resp Panel by RT-PCR (Flu A&B, Covid) Nasopharyngeal Swab     Status: None   Collection Time: 08/03/21  3:25 PM   Specimen: Nasopharyngeal Swab; Nasopharyngeal(NP) swabs in vial transport medium  Result Value Ref Range Status   SARS Coronavirus 2 by RT PCR NEGATIVE NEGATIVE Final    Comment: (NOTE) SARS-CoV-2 target nucleic acids are NOT DETECTED.  The SARS-CoV-2 RNA is generally detectable in upper respiratory specimens during the acute phase of infection. The lowest concentration of SARS-CoV-2 viral copies this assay can detect is 138 copies/mL. A negative result does not preclude SARS-Cov-2 infection and should not be used as the sole basis for treatment or other patient management decisions. A negative result may occur with  improper specimen collection/handling, submission of specimen other than nasopharyngeal swab, presence of viral mutation(s) within the areas targeted by this assay, and inadequate number of viral copies(<138 copies/mL). A negative result must be combined with clinical observations, patient history, and epidemiological information. The expected result is Negative.  Fact Sheet for Patients:  BloggerCourse.com  Fact Sheet for Healthcare Providers:  SeriousBroker.it  This test is no t yet approved or cleared by the [place] FDA and  has been authorized for detection and/or diagnosis of SARS-CoV-2 by FDA under an Emergency Use Authorization (EUA). This EUA will remain  in effect (meaning this test can be used) for the duration of the COVID-19 declaration under Section 564(b)(1) of the Act, 21 U.S.C.section 360bbb-3(b)(1), unless the authorization is terminated  or revoked sooner.       Influenza A by PCR NEGATIVE NEGATIVE Final   Influenza B by PCR NEGATIVE NEGATIVE Final    Comment: (NOTE) The Xpert Xpress  SARS-CoV-2/FLU/RSV plus assay is intended as an aid in the diagnosis of influenza from Nasopharyngeal swab specimens and should not be used as a sole basis for treatment. Nasal washings and aspirates are unacceptable for Xpert Xpress SARS-CoV-2/FLU/RSV testing.  Fact Sheet for Patients: BloggerCourse.com  Fact Sheet for Healthcare Providers: SeriousBroker.it  This test is not yet approved or cleared by the [place] FDA and has been authorized for detection and/or diagnosis of SARS-CoV-2 by FDA under an Emergency Use Authorization (EUA). This EUA will remain in effect (meaning this test can be used) for the duration of the COVID-19 declaration under Section 564(b)(1) of the Act, 21 U.S.C. section 360bbb-3(b)(1), unless the authorization is terminated or revoked.  Performed at [facility], [address] [place], [place] [address]          Radiology Studies: DG Chest 2 View  Result Date: 08/10/2021 CLINICAL DATA:  Shortness of breath in a 50 year old male. History of pulmonary embolism. EXAM: CHEST - 2 VIEW COMPARISON:  August 06, 2021. FINDINGS: Stable appearance of cardiomediastinal contours accounting for obscured cardiac borders on the LEFT  due to persistent LEFT-sided pleural effusion and basilar airspace disease. This appearance is unchanged compared to recent imaging. No pneumothorax. Patchy RIGHT basilar opacities likely atelectatic changes. Minimal RIGHT basilar opacities are similar to previous imaging. There is no visible pneumothorax. EKG leads project over the chest. On limited assessment there is no acute skeletal process. IMPRESSION: Stable appearance of the chest with LEFT lower lobe consolidative process and concomitant effusion and scattered RIGHT basilar opacities. Electronically Signed   By: Donzetta Kohut M.D.   On: 08/10/2021 14:00   PERIPHERAL VASCULAR CATHETERIZATION  Result Date:  08/09/2021 See surgical note for result.       Scheduled Meds:  apixaban  10 mg Oral BID   Followed by   Melene Muller ON 08/11/2021] apixaban  5 mg Oral BID   dextrose  50 mL Intravenous Once   ferrous fumarate-b12-vitamic C-folic acid  1 capsule Oral TID PC   insulin aspart  0-15 Units Subcutaneous TID WC   insulin aspart  0-5 Units Subcutaneous QHS   insulin aspart  8 Units Subcutaneous TID WC   insulin glargine-yfgn  30 Units Subcutaneous BID   ipratropium-albuterol  3 mL Nebulization Q6H   lisinopril  10 mg Oral Daily   sertraline  100 mg Oral Daily   Continuous Infusions:   LOS: 5 days    Time spent: 42 minutes spent on chart review, discussion with nursing staff, consultants, updating family and interview/physical exam; more than 50% of that time was spent in counseling and/or coordination of care.    Alvira Philips [place], DO [facility] Available via Epic secure chat 7am-7pm After these hours, please refer to coverage provider listed on [email or web address] 08/10/2021, 5:26 PM

## 2021-08-10 NOTE — Progress Notes (Signed)
Inpatient Diabetes Program Recommendations  AACE/ADA: New Consensus Statement on Inpatient Glycemic Control   Target Ranges:  Prepandial:   less than 140 mg/dL      Peak postprandial:   less than 180 mg/dL (1-2 hours)      Critically ill patients:  140 - 180 mg/dL   Results for William Lin, William Lin (MRN [id number]) as of 08/10/2021 09:00  Ref. Range 08/09/2021 08:13 08/09/2021 11:38 08/09/2021 14:52 08/09/2021 16:36 08/09/2021 18:02 08/09/2021 21:26 08/10/2021 08:06  Glucose-Capillary Latest Ref Range: 70 - 99 mg/dL 867 (H)  Novolog 3 units 131 (H)  Novolog 2 units 68 (L) 144 (H) 103 (H) 226 (H)  Novolog 2 units  Semglee 30 units 104 (H)  Novolog 8 units  Semglee 30 units   Review of Glycemic Control  Diabetes history: DM2 Outpatient Diabetes medications: Actos 15 mg daily, Metformin XR 1000 mg BID Current orders for Inpatient glycemic control: Semglee 30 units BID, Novolog 0-15 units TID with meals, Novolog 0-5 units QHS, Novolog 8 units TID with meals   Inpatient Diabetes Program Recommendations:    Insulin: In reviewing chart, noted Semglee 30 units scheduled for yesterday morning was NOT GIVEN but evening dose of Semglee 30 units was given last night. Patient was NPO yesterday morning for procedure.  If patient has any other issues with hypoglycemia, may need to decrease Semglee and meal coverage.  Thanks, Orlando Penner, RN, MSN, CDE Diabetes Coordinator Inpatient Diabetes Program [phone] (Team Pager from 8am to 5pm)

## 2021-08-11 DIAGNOSIS — F418 Other specified anxiety disorders: Secondary | ICD-10-CM | POA: Diagnosis not present

## 2021-08-11 DIAGNOSIS — E119 Type 2 diabetes mellitus without complications: Secondary | ICD-10-CM | POA: Diagnosis not present

## 2021-08-11 DIAGNOSIS — I10 Essential (primary) hypertension: Secondary | ICD-10-CM | POA: Diagnosis not present

## 2021-08-11 DIAGNOSIS — I2699 Other pulmonary embolism without acute cor pulmonale: Secondary | ICD-10-CM | POA: Diagnosis not present

## 2021-08-11 LAB — RETIC PANEL
Immature Retic Fract: 29.5 % — ABNORMAL HIGH (ref 2.3–15.9)
RBC.: 3.44 MIL/uL — ABNORMAL LOW (ref 4.22–5.81)
Retic Count, Absolute: 96.7 10*3/uL (ref 19.0–186.0)
Retic Ct Pct: 2.8 % (ref 0.4–3.1)
Reticulocyte Hemoglobin: 22.5 pg — ABNORMAL LOW (ref >27.9)

## 2021-08-11 LAB — CBC WITH DIFFERENTIAL/PLATELET
Abs Immature Granulocytes: 0.17 10*3/uL — ABNORMAL HIGH (ref 0.00–0.07)
Basophils Absolute: 0 10*3/uL (ref 0.0–0.1)
Basophils Relative: 0 %
Eosinophils Absolute: 0.2 10*3/uL (ref 0.0–0.5)
Eosinophils Relative: 2 %
HCT: 23 % — ABNORMAL LOW (ref 39.0–52.0)
Hemoglobin: 8.5 g/dL — ABNORMAL LOW (ref 13.0–17.0)
Immature Granulocytes: 2 %
Lymphocytes Relative: 17 %
Lymphs Abs: 1.5 10*3/uL (ref 0.7–4.0)
MCH: 25.4 pg — ABNORMAL LOW (ref 26.0–34.0)
MCHC: 37 g/dL — ABNORMAL HIGH (ref 30.0–36.0)
MCV: 68.9 fL — ABNORMAL LOW (ref 80.0–100.0)
Monocytes Absolute: 0.7 10*3/uL (ref 0.1–1.0)
Monocytes Relative: 8 %
Neutro Abs: 6.6 10*3/uL (ref 1.7–7.7)
Neutrophils Relative %: 71 %
Platelets: 225 10*3/uL (ref 150–400)
RBC: 3.34 MIL/uL — ABNORMAL LOW (ref 4.22–5.81)
RDW: 16.6 % — ABNORMAL HIGH (ref 11.5–15.5)
Smear Review: NORMAL
WBC: 9.3 10*3/uL (ref 4.0–10.5)
nRBC: 0 % (ref 0.0–0.2)

## 2021-08-11 LAB — COMPREHENSIVE METABOLIC PANEL
ALT: 20 U/L (ref 0–44)
AST: 21 U/L (ref 15–41)
Albumin: 2.8 g/dL — ABNORMAL LOW (ref 3.5–5.0)
Alkaline Phosphatase: 65 U/L (ref 38–126)
Anion gap: 9 (ref 5–15)
BUN: 8 mg/dL (ref 6–20)
CO2: 27 mmol/L (ref 22–32)
Calcium: 8.7 mg/dL — ABNORMAL LOW (ref 8.9–10.3)
Chloride: 103 mmol/L (ref 98–111)
Creatinine, Ser: 0.78 mg/dL (ref 0.61–1.24)
GFR, Estimated: >60 mL/min (ref >60)
Glucose, Bld: 130 mg/dL — ABNORMAL HIGH (ref 70–99)
Potassium: 3.3 mmol/L — ABNORMAL LOW (ref 3.5–5.1)
Sodium: 139 mmol/L (ref 135–145)
Total Bilirubin: 1.3 mg/dL — ABNORMAL HIGH (ref 0.3–1.2)
Total Protein: 7.1 g/dL (ref 6.5–8.1)

## 2021-08-11 LAB — LACTATE DEHYDROGENASE: LDH: 134 U/L (ref 98–192)

## 2021-08-11 LAB — GLUCOSE, CAPILLARY
Glucose-Capillary: 112 mg/dL — ABNORMAL HIGH (ref 70–99)
Glucose-Capillary: 159 mg/dL — ABNORMAL HIGH (ref 70–99)
Glucose-Capillary: 194 mg/dL — ABNORMAL HIGH (ref 70–99)
Glucose-Capillary: 137 mg/dL — ABNORMAL HIGH (ref 70–99)

## 2021-08-11 LAB — MAGNESIUM: Magnesium: 1.8 mg/dL (ref 1.7–2.4)

## 2021-08-11 MED ORDER — MAGNESIUM SULFATE 2 GM/50ML IV SOLN
2.0000 g | Freq: Once | INTRAVENOUS | Status: AC
  Administered 2021-08-11: 2 g via INTRAVENOUS
  Filled 2021-08-11: qty 50

## 2021-08-11 MED ORDER — POTASSIUM CHLORIDE CRYS ER 20 MEQ PO TBCR
30.0000 meq | EXTENDED_RELEASE_TABLET | ORAL | Status: AC
  Administered 2021-08-11 (×2): 30 meq via ORAL
  Filled 2021-08-11 (×2): qty 1

## 2021-08-11 NOTE — Progress Notes (Signed)
   08/11/21 1130  Clinical Encounter Type  Visited With Patient not available  Spiritual Encounters  Spiritual Needs Other (Comment) (not assessed)  Chaplain Burris attempted to visit with Pt and also to offer support to Pt's spouse. Pt was bathing per his nurse. Will attempt to visit later.

## 2021-08-11 NOTE — TOC Initial Note (Signed)
Transition of Care [facility]) - Initial/Assessment Note    Patient Details  Name: William Lin MRN: [id number] Date of Birth: 12/29/1970  Transition of [facility]) CM/SW Contact:    Gildardo Griffes, LCSW Phone Number: 08/11/2021, 2:02 PM  Clinical Narrative:                  CSW spoke with patient regarding potential dc tomorrow. Discussed home health recs, patient reports he's going to be doing pulmonary rehab once a week per his doctor and that he does not feel the note for home health at this time. No equipment needs identified. Reports he has no problems picking up his medications or getting to and from appointments.    Reports he has a family member that will be picking him up tomorrow.   No discharge needs identified.   Expected Discharge Plan: Home/Self Care Barriers to Discharge: No Barriers Identified   Patient Goals and CMS Choice Patient states their goals for this hospitalization and ongoing recovery are:: to go home CMS Medicare.gov Compare Post Acute Care list provided to:: Patient Choice offered to / list presented to : Patient  Expected Discharge Plan and Services Expected Discharge Plan: Home/Self Care                                              Prior Living Arrangements/Services                       Activities of Daily Living Home Assistive Devices/Equipment: None ADL Screening (condition at time of admission) Patient's cognitive ability adequate to safely complete daily activities?: Yes Is the patient deaf or have difficulty hearing?: No Does the patient have difficulty seeing, even when wearing glasses/contacts?: No Does the patient have difficulty concentrating, remembering, or making decisions?: No Patient able to express need for assistance with ADLs?: No Does the patient have difficulty dressing or bathing?: No Independently performs ADLs?: Yes (appropriate for developmental age) Does the patient have difficulty walking or climbing  stairs?: No Weakness of Legs: None Weakness of Arms/Hands: None  Permission Sought/Granted                  Emotional Assessment Appearance:: Appears stated age Attitude/Demeanor/Rapport: Gracious Affect (typically observed): Calm Orientation: : Oriented to Self, Oriented to Place, Oriented to  Time, Oriented to Situation Alcohol / Substance Use: Not Applicable Psych Involvement: No (comment)  Admission diagnosis:  Shortness of breath [R06.02] Pulmonary embolism (HCC) [I26.99] Dyspnea [R06.00] Chest pain on breathing [R07.1] Acute pulmonary embolism (HCC) [I26.99] Multiple subsegmental pulmonary emboli without acute cor pulmonale (HCC) [I26.94] Patient Active Problem List   Diagnosis Date Noted   Pulmonary embolism (HCC) 08/05/2021   Chest pain on breathing    Dyspnea    Shortness of breath    Acute pulmonary embolism (HCC) 08/03/2021   HTN (hypertension) 08/03/2021   Diabetes mellitus without complication (HCC)    Depression with anxiety    Pleural effusion 07/31/2011   PCP:  [facility]:   [facility] [phone] [place]in[place] Otto,  - [address] [place] [address] Phone: [phone] Fax: [phone]     Social Determinants of Health (SDOH) Interventions    Readmission Risk Interventions No flowsheet data found.

## 2021-08-11 NOTE — Progress Notes (Signed)
PROGRESS NOTE    William Lin  VCB:[id number] DOB: October 03, 1971 DOA: 08/03/2021 PCP: [facility] [place]    Brief Narrative:  William Lin is a 50 year old male with past medical history significant for essential hypertension, type 2 diabetes mellitus, depression/anxiety, sickle cell anemia (mild variance without previous crisis per patient), who presented to [facility] ED on 10/19 with chest pain.  Patient reports long distance travel recently, flew to [place] a week ago and then [place] a month ago.  He also reported discomfort in his left calf area which resolved roughly 1 week ago.  Patient was seen in urgent care and found to have a positive D-dimer and sent to the ED for further evaluation.  In the ED, WBC count 10.2, troponin 3, D-dimer 1.36, afebrile, BP 162/104, HR 106, RR 27, SPO2 96% on room air.  CT angiogram chest notable for pulmonary embolism, segmental and subsegmental left lower lobe pulmonary arteries, no CT evidence of right heart strain.  Patient was started on IV heparin drip.  Duration consulted for further evaluation management.   Assessment & Plan:   Principal Problem:   Acute pulmonary embolism (HCC) Active Problems:   Diabetes mellitus without complication (HCC)   Depression with anxiety   HTN (hypertension)   Pulmonary embolism (HCC)   Acute hypoxic respiratory failure, POA Acute pulmonary embolism Patient presenting to the ED after elevated D-dimer at urgent care.  Recent prolonged travel with several flights including to [place] in [place] over the last month.  Also reports recent left calf pain; that now has resolved.  CT angiogram chest notable for pulmonary embolism left lower lobe segmental and segment segmental pulmonary arteries.  Patient was initially started on heparin drip.  Pulmonology and vascular surgery were consulted and followed on hospital course.  Patient underwent injection of his left pulmonary arteries by Dr. Gilda Crease on 10/25 with  findings of no flow-limiting thrombus within the left pulmonary artery, segmental or subsegmental branches and no thrombectomy was indicated. --PCCM following, appreciate assistance --Eliquis 5mg  BID --DuoNebs q6h prn --Albuterol MDI as needed --Toradol 15 mg IV every 6 hours x 5 days --Percocet 5-325 mg p.o. q4h prn moderate pain --Continue supplemental oxygen, maintain SPO2 >92%, now titrated off --repeat ambulatory O2 screen today  Hypokalemia Hypomagnesemia Potassium 3.3, magnesium 1.8, will replete. --Repeat electrolytes in a.m.  Atelectasis left lung --Continue incentive spirometry, flutter valve  Left lung bronchopneumonia --WBC 15.1>16.2>12.9>12.1>11.3>9.3 --PCT 1.40>0.15 --Completed course of azithromycin and ceftriaxone  Type 2 diabetes mellitus At home on metformin 1000 mg p.o. twice daily, pioglitazone 15 mg p.o. daily.  Hemoglobin A1c 6.8 on 08/03/2021; controlled. --Semglee 30u [place] BID --NovoLog 8 units 3 times daily AC --SSI for coverage --CBGs qAC/HS  Essential hypertension --Lisinopril 10 mg p.o. daily  Depression/anxiety: Sertraline 100 mg p.o. daily  Sickle cell disease Hemoglobin electrophoresis normal for pattern concentrations consistent with homozygous sickle cell disease with hemoglobin F increased suggestive of hereditary persistent fetal gene.  Reticulocyte count and LDH within normal limits.  Outpatient follow-up with PCP, could consider outpatient referral to hematology.   DVT prophylaxis:  apixaban (ELIQUIS) tablet 5 mg    Code Status: Full Code Family Communication: Updated patient's spouse present at bedside this morning  Disposition Plan:  Level of care: Progressive Cardiac Status is: Inpatient  Remains inpatient appropriate because: Chest pain improving, continues on IV Toradol, reassessing amatory O2 screen today.  Dissipate discharge home tomorrow.     Consultants:  PCCM Vascular surgery  Procedures:  Left pulmonary  artery  injection, vascular surgery, Dr. Juleen Starr 10/25  Antimicrobials:  Cefazolin 10/26 - 10/26 Azithromycin 10/21 -10/23 Ceftriaxone 10/21 - 10/25   Subjective: Patient seen examined bedside, resting comfortably in bed.  Spouse present at bedside.  Chest pain improved, titrated off of submental oxygen overnight.  Oxygenating well at rest.  Cough also improved.  Discussed plan to continue IV Toradol today and reassess O2 ambulatory screen.  And likely discharge tomorrow if no further complications.  No other questions or concerns at this time.  Denies headache, no fever/chills/night sweats, no nausea/vomiting/diarrhea, no palpitations, no abdominal pain, no weakness, no fatigue, no paresthesias.  No acute events overnight per nurse staff.  Objective: Vitals:   08/11/21 0539 08/11/21 0754 08/11/21 0824 08/11/21 1150  BP: 124/82 126/80  122/83  Pulse: 99 88 89 (!) 108  Resp: 18 19 18 19   Temp: 98.2 F (36.8 C) 98.8 F (37.1 C)  98.4 F (36.9 C)  TempSrc:    Oral  SpO2: 94% 96% 94% 99%  Weight:      Height:        Intake/Output Summary (Last 24 hours) at 08/11/2021 1339 Last data filed at 08/11/2021 1151 Gross per 24 hour  Intake 1200 ml  Output 2775 ml  Net -1575 ml   Filed Weights   08/03/21 1214  Weight: 99.8 kg    Examination:  General exam: Appears calm and comfortable slightly ill in appearance Respiratory system: Clear to auscultation. Respiratory effort normal.  On room air at rest.   Cardiovascular system: S1 & S2 heard, RRR. No JVD, murmurs, rubs, gallops or clicks. No pedal edema. Gastrointestinal system: Abdomen is nondistended, soft and nontender. No organomegaly or masses felt. Normal bowel sounds heard. Central nervous system: Alert and oriented. No focal neurological deficits. Extremities: Symmetric 5 x 5 power. Skin: No rashes, lesions or ulcers Psychiatry: Judgement and insight appear normal. Mood & affect appropriate.     Data Reviewed: I have personally  reviewed following labs and imaging studies  CBC: Recent Labs  Lab 08/06/21 0941 08/07/21 1052 08/08/21 0900 08/09/21 0447 08/10/21 0438 08/11/21 0614  WBC 15.1* 16.2* 12.9* 12.1* 11.3* 9.3  NEUTROABS 12.0*  --  9.8*  --   --  6.6  HGB 10.1* 10.3* 9.7* 9.0* 8.5* 8.5*  HCT 27.9* 28.0* 26.6* 25.2* 24.1* 23.0*  MCV 71.2* 69.1* 69.5* 67.9* 69.3* 68.9*  PLT 161 200 200 203 214 225   Basic Metabolic Panel: Recent Labs  Lab 08/05/21 0613 08/07/21 1052 08/09/21 0447 08/10/21 0438 08/11/21 0614  NA 134* 136 139 139 139  K 3.8 3.2* 2.8* 3.4* 3.3*  CL 100 96* 100 104 103  CO2 23 25 29 28 27   GLUCOSE 280* 279* 181* 131* 130*  BUN 11 9 9 8 8   CREATININE 0.88 0.62 0.75 0.66 0.78  CALCIUM 9.3 9.2 9.1 8.7* 8.7*  MG 1.6*  --  1.7  --  1.8  PHOS 3.7  --   --   --   --    GFR: Estimated Creatinine Clearance: 127.9 mL/min (by C-G formula based on SCr of 0.78 mg/dL). Liver Function Tests: Recent Labs  Lab 08/11/21 0614  AST 21  ALT 20  ALKPHOS 65  BILITOT 1.3*  PROT 7.1  ALBUMIN 2.8*   No results for input(s): LIPASE, AMYLASE in the last 168 hours. No results for input(s): AMMONIA in the last 168 hours. Coagulation Profile: No results for input(s): INR, PROTIME in the last 168 hours. Cardiac Enzymes: No  results for input(s): CKTOTAL, CKMB, CKMBINDEX, TROPONINI in the last 168 hours. BNP (last 3 results) No results for input(s): PROBNP in the last 8760 hours. HbA1C: No results for input(s): HGBA1C in the last 72 hours. CBG: Recent Labs  Lab 08/10/21 0806 08/10/21 1233 08/10/21 2046 08/11/21 0754 08/11/21 1148  GLUCAP 104* 178* 111* 112* 159*   Lipid Profile: No results for input(s): CHOL, HDL, LDLCALC, TRIG, CHOLHDL, LDLDIRECT in the last 72 hours. Thyroid Function Tests: No results for input(s): TSH, T4TOTAL, FREET4, T3FREE, THYROIDAB in the last 72 hours. Anemia Panel: Recent Labs    08/11/21 0614  RETICCTPCT 2.8   Sepsis Labs: Recent Labs  Lab  08/05/21 1440 08/10/21 1002  PROCALCITON 1.40 0.15    Recent Results (from the past 240 hour(s))  Resp Panel by RT-PCR (Flu A&B, Covid) Nasopharyngeal Swab     Status: None   Collection Time: 08/03/21  3:25 PM   Specimen: Nasopharyngeal Swab; Nasopharyngeal(NP) swabs in vial transport medium  Result Value Ref Range Status   SARS Coronavirus 2 by RT PCR NEGATIVE NEGATIVE Final    Comment: (NOTE) SARS-CoV-2 target nucleic acids are NOT DETECTED.  The SARS-CoV-2 RNA is generally detectable in upper respiratory specimens during the acute phase of infection. The lowest concentration of SARS-CoV-2 viral copies this assay can detect is 138 copies/mL. A negative result does not preclude SARS-Cov-2 infection and should not be used as the sole basis for treatment or other patient management decisions. A negative result may occur with  improper specimen collection/handling, submission of specimen other than nasopharyngeal swab, presence of viral mutation(s) within the areas targeted by this assay, and inadequate number of viral copies(<138 copies/mL). A negative result must be combined with clinical observations, patient history, and epidemiological information. The expected result is Negative.  Fact Sheet for Patients:  BloggerCourse.com  Fact Sheet for Healthcare Providers:  SeriousBroker.it  This test is no t yet approved or cleared by the [place] FDA and  has been authorized for detection and/or diagnosis of SARS-CoV-2 by FDA under an Emergency Use Authorization (EUA). This EUA will remain  in effect (meaning this test can be used) for the duration of the COVID-19 declaration under Section 564(b)(1) of the Act, 21 U.S.C.section 360bbb-3(b)(1), unless the authorization is terminated  or revoked sooner.       Influenza A by PCR NEGATIVE NEGATIVE Final   Influenza B by PCR NEGATIVE NEGATIVE Final    Comment: (NOTE) The Xpert  Xpress SARS-CoV-2/FLU/RSV plus assay is intended as an aid in the diagnosis of influenza from Nasopharyngeal swab specimens and should not be used as a sole basis for treatment. Nasal washings and aspirates are unacceptable for Xpert Xpress SARS-CoV-2/FLU/RSV testing.  Fact Sheet for Patients: BloggerCourse.com  Fact Sheet for Healthcare Providers: SeriousBroker.it  This test is not yet approved or cleared by the [place] FDA and has been authorized for detection and/or diagnosis of SARS-CoV-2 by FDA under an Emergency Use Authorization (EUA). This EUA will remain in effect (meaning this test can be used) for the duration of the COVID-19 declaration under Section 564(b)(1) of the Act, 21 U.S.C. section 360bbb-3(b)(1), unless the authorization is terminated or revoked.  Performed at [facility], [address] [place], [place] [address]          Radiology Studies: DG Chest 2 View  Result Date: 08/10/2021 CLINICAL DATA:  Shortness of breath in a 50 year old male. History of pulmonary embolism. EXAM: CHEST - 2 VIEW COMPARISON:  August 06, 2021. FINDINGS: Stable appearance of cardiomediastinal contours accounting for obscured cardiac borders on the LEFT due to persistent LEFT-sided pleural effusion and basilar airspace disease. This appearance is unchanged compared to recent imaging. No pneumothorax. Patchy RIGHT basilar opacities likely atelectatic changes. Minimal RIGHT basilar opacities are similar to previous imaging. There is no visible pneumothorax. EKG leads project over the chest. On limited assessment there is no acute skeletal process. IMPRESSION: Stable appearance of the chest with LEFT lower lobe consolidative process and concomitant effusion and scattered RIGHT basilar opacities. Electronically Signed   By: Donzetta Kohut M.D.   On: 08/10/2021 14:00   PERIPHERAL VASCULAR CATHETERIZATION  Result Date:  08/09/2021 See surgical note for result.       Scheduled Meds:  apixaban  5 mg Oral BID   dextrose  50 mL Intravenous Once   ferrous fumarate-b12-vitamic C-folic acid  1 capsule Oral TID PC   insulin aspart  0-15 Units Subcutaneous TID WC   insulin aspart  0-5 Units Subcutaneous QHS   insulin aspart  8 Units Subcutaneous TID WC   insulin glargine-yfgn  30 Units Subcutaneous BID   ipratropium-albuterol  3 mL Nebulization Q6H   ketorolac  15 mg Intravenous Q6H   lisinopril  10 mg Oral Daily   sertraline  100 mg Oral Daily   Continuous Infusions:   LOS: 6 days    Time spent: 38 minutes spent on chart review, discussion with nursing staff, consultants, updating family and interview/physical exam; more than 50% of that time was spent in counseling and/or coordination of care.    Alvira Philips [place], DO [facility] Available via Epic secure chat 7am-7pm After these hours, please refer to coverage provider listed on [email or web address] 08/11/2021, 1:39 PM

## 2021-08-11 NOTE — Progress Notes (Signed)
SATURATION QUALIFICATIONS: (This note is used to comply with regulatory documentation for home oxygen)  Patient Saturations on Room Air at Rest = 95%  Patient Saturations on Room Air while Ambulating = 94%  Patient Saturations on n/a Liters of oxygen while Ambulating = n/a%  Please briefly explain why patient needs home oxygen: Patient able to maintain O2 saturations on room air >93% with ambulation of ~1000 ft<. Patient Does not qualify for home O2.

## 2021-08-11 NOTE — Progress Notes (Addendum)
OT Cancellation Note  Patient Details Name: William Lin MRN: [id number] DOB: 25-Aug-1971   Cancelled Treatment:    Reason Eval/Treat Not Completed: Patient at procedure or test/ unavailable Pt in restroom working on bathing with his spouse when OT presents for treatment. Politely request come back at later time. OT encourages spouse to let pt do as much for himself as possible to maintain strength and INDEP. Request bed be re-made and OT supplies linens and calls CNA. Will f/u at later date/time for treatment as able/pt available. Thank you.  Rejeana Brock, MS, OTR/L ascom [phone] 08/11/21, 1:42 PM

## 2021-08-12 DIAGNOSIS — F418 Other specified anxiety disorders: Secondary | ICD-10-CM | POA: Diagnosis not present

## 2021-08-12 DIAGNOSIS — E119 Type 2 diabetes mellitus without complications: Secondary | ICD-10-CM | POA: Diagnosis not present

## 2021-08-12 DIAGNOSIS — I2699 Other pulmonary embolism without acute cor pulmonale: Secondary | ICD-10-CM | POA: Diagnosis not present

## 2021-08-12 DIAGNOSIS — I10 Essential (primary) hypertension: Secondary | ICD-10-CM | POA: Diagnosis not present

## 2021-08-12 LAB — BASIC METABOLIC PANEL
Anion gap: 6 (ref 5–15)
BUN: 10 mg/dL (ref 6–20)
CO2: 26 mmol/L (ref 22–32)
Calcium: 8.6 mg/dL — ABNORMAL LOW (ref 8.9–10.3)
Chloride: 104 mmol/L (ref 98–111)
Creatinine, Ser: 0.76 mg/dL (ref 0.61–1.24)
GFR, Estimated: >60 mL/min (ref >60)
Glucose, Bld: 106 mg/dL — ABNORMAL HIGH (ref 70–99)
Potassium: 3.9 mmol/L (ref 3.5–5.1)
Sodium: 136 mmol/L (ref 135–145)

## 2021-08-12 LAB — GLUCOSE, CAPILLARY: Glucose-Capillary: 102 mg/dL — ABNORMAL HIGH (ref 70–99)

## 2021-08-12 LAB — MAGNESIUM: Magnesium: 2.1 mg/dL (ref 1.7–2.4)

## 2021-08-12 MED ORDER — ALBUTEROL SULFATE HFA 108 (90 BASE) MCG/ACT IN AERS: 2.0000 | INHALATION_SPRAY | RESPIRATORY_TRACT | 2 refills | Status: AC | PRN

## 2021-08-12 MED ORDER — CELECOXIB 100 MG PO CAPS: 100.0000 mg | ORAL_CAPSULE | Freq: Two times a day (BID) | ORAL | 0 refills | Status: AC

## 2021-08-12 MED ORDER — APIXABAN 5 MG PO TABS: 5.0000 mg | ORAL_TABLET | Freq: Two times a day (BID) | ORAL | 2 refills | Status: AC

## 2021-08-12 MED ORDER — HYDROCOD POLST-CPM POLST ER 10-8 MG/5ML PO SUER: 5.0000 mL | Freq: Two times a day (BID) | ORAL | 0 refills | Status: AC | PRN

## 2021-08-12 NOTE — Plan of Care (Signed)
  Problem: Education: Goal: Knowledge of General Education information will improve Description: Including pain rating scale, medication(s)/side effects and non-pharmacologic comfort measures Outcome: Adequate for Discharge   Problem: Health Behavior/Discharge Planning: Goal: Ability to manage health-related needs will improve Outcome: Adequate for Discharge   Problem: Clinical Measurements: Goal: Ability to maintain clinical measurements within normal limits will improve Outcome: Adequate for Discharge Goal: Will remain free from infection Outcome: Adequate for Discharge Goal: Diagnostic test results will improve Outcome: Adequate for Discharge Goal: Respiratory complications will improve Outcome: Adequate for Discharge Goal: Cardiovascular complication will be avoided Outcome: Adequate for Discharge   Problem: Activity: Goal: Risk for activity intolerance will decrease Outcome: Adequate for Discharge   Problem: Nutrition: Goal: Adequate nutrition will be maintained Outcome: Adequate for Discharge   Problem: Coping: Goal: Level of anxiety will decrease Outcome: Adequate for Discharge   Problem: Elimination: Goal: Will not experience complications related to bowel motility Outcome: Adequate for Discharge Goal: Will not experience complications related to urinary retention Outcome: Adequate for Discharge   Problem: Pain Managment: Goal: General experience of comfort will improve Outcome: Adequate for Discharge   Problem: Safety: Goal: Ability to remain free from injury will improve Outcome: Adequate for Discharge   Problem: Skin Integrity: Goal: Risk for impaired skin integrity will decrease Outcome: Adequate for Discharge  Pt dc per MD order, dc instructions, and follow up appts. gone over with pt and family, all questions answered.

## 2021-08-12 NOTE — Progress Notes (Signed)
Pulmonary Medicine          Date: 08/12/2021,   MRN# [id number] William Lin 09/29/1971     AdmissionWeight: 99.8 kg                 CurrentWeight: 99.8 kg   Reffering physician: Dr Reesa Chew    CHIEF COMPLAINT:   Acute hypoxemic respiratory failure   HISTORY OF PRESENT ILLNESS   This is a 50 yo M with hx of DM, MDD, anxiety , pleural effusions, chronic anemia of unclear diagnosis possible genetic came in with complaints of chest discomfort which was severe and with left arm radiation. He denies URI symptoms, sick contacts , NVD, admits to cross country travel to [place] and .  He had CT angio done showing Left sided PE's no RH strain. His H/h started to trend down and patient developed pleural effusion. PCCM for further evaluation and management. Hes on 3-4L/min [place] for supplemental O2.  He is eating and going to bathroom ok.  I met with sister [name] of patient and spoke to wife [name] on phone.   08/08/21- patient is slightly improved, he is on 3L/min [place] and reports dull chest pain over left hemithorax appx 4/10 at worst. He is able to speak in full sentences in non labored breathing pattern. He is scheduled for thoracenteis today at 1pm.   08/09/21- patient remains on 3L/min [place]. Wife at bedside. They have discussed with vascular team and additional intervention is planned.   08/10/21- patient continues to improve slowly.  He continues to have chest pain with 4/10 level. He had surgical evaluation. We reviewed CT chest and interval CXR today.  08/11/21- PATIENT CLEARED FOR DC HOME , I HAVE LEFT CONTACT INFORMATION WITH WIFE AND PLAN FOR FOLLOW UP IN CLINIC WITHIN 7 DAYS.  PATIETN IS ON ROOM AIR  PAST MEDICAL HISTORY   Past Medical History:  Diagnosis Date  . Anxiety   . Diabetes mellitus without complication ([place])   . HTN (hypertension)   . Sickle cell anemia (H[facility])      SURGICAL HISTORY   Past Surgical History:  Procedure Laterality Date  .  CHOLECYSTECTOMY    . DENTAL SURGERY    . polynodial cyst    . PULMONARY THROMBECTOMY Bilateral 08/09/2021   Procedure: PULMONARY THROMBECTOMY;  Surgeon: Katha Cabal, MD;  Location: [facility];  Service: Cardiovascular;  Laterality: Bilateral;  . ROTATOR CUFF REPAIR     Left     FAMILY HISTORY   Family History  Problem Relation Age of Onset  . Hypertension Other   . Hypertension Mother   . Arthritis Mother      SOCIAL HISTORY   Social History   Tobacco Use  . Smoking status: Never  . Smokeless tobacco: Never  Substance Use Topics  . Alcohol use: No  . Drug use: Never     MEDICATIONS    Home Medication:    Current Medication:  Current Facility-Administered Medications:  .  acetaminophen (TYLENOL) tablet 650 mg, 650 mg, Oral, Q6H PRN, Schnier, Dolores Lory, MD, 650 mg at 08/08/21 0902 .  albuterol (VENTOLIN HFA) 108 (90 Base) MCG/ACT inhaler 2 puff, 2 puff, Inhalation, Q4H PRN, Schnier, Dolores Lory, MD .  [COMPLETED] apixaban (ELIQUIS) tablet 10 mg, 10 mg, Oral, BID, 10 mg at 08/10/21 2202 **FOLLOWED BY** apixaban (ELIQUIS) tablet 5 mg, 5 mg, Oral, BID, Schnier, Dolores Lory, MD, 5 mg at 08/11/21 2208 .  chlorpheniramine-HYDROcodone (TUSSIONEX) 10-8 MG/5ML suspension  5 mL, 5 mL, Oral, Q12H PRN, [place] Donnamarie Poag, DO, 5 mL at 08/11/21 2327 .  dextrose 50 % solution 50 mL, 50 mL, Intravenous, Once, Schnier, Dolores Lory, MD .  ferrous YOVZCHYI-F02-DXAJOIN C-folic acid (TRINSICON / FOLTRIN) capsule 1 capsule, 1 capsule, Oral, TID PC, Schnier, Dolores Lory, MD, 1 capsule at 08/11/21 1841 .  hydrALAZINE (APRESOLINE) injection 5 mg, 5 mg, Intravenous, Q2H PRN, Schnier, Dolores Lory, MD .  HYDROmorphone (DILAUDID) injection 1 mg, 1 mg, Intravenous, Q4H PRN, Schnier, Dolores Lory, MD, 1 mg at 08/10/21 0005 .  HYDROmorphone (DILAUDID) injection 1 mg, 1 mg, Intravenous, Once PRN, Schnier, Dolores Lory, MD .  insulin aspart (novoLOG) injection 0-15 Units, 0-15 Units, Subcutaneous, TID WC,  Schnier, Dolores Lory, MD, 3 Units at 08/11/21 1842 .  insulin aspart (novoLOG) injection 0-5 Units, 0-5 Units, Subcutaneous, QHS, Schnier, Dolores Lory, MD, 2 Units at 08/09/21 2142 .  insulin aspart (novoLOG) injection 8 Units, 8 Units, Subcutaneous, TID WC, Schnier, Dolores Lory, MD, 8 Units at 08/11/21 1841 .  insulin glargine-yfgn (SEMGLEE) injection 30 Units, 30 Units, Subcutaneous, BID, Schnier, Dolores Lory, MD, 30 Units at 08/11/21 2208 .  ipratropium-albuterol (DUONEB) 0.5-2.5 (3) MG/3ML nebulizer solution 3 mL, 3 mL, Nebulization, Q6H, Schnier, Dolores Lory, MD, 3 mL at 08/12/21 0224 .  ketorolac (TORADOL) 15 MG/ML injection 15 mg, 15 mg, Intravenous, Q6H, [place] Donnamarie Poag, DO, 15 mg at 08/12/21 8676 .  lisinopril (ZESTRIL) tablet 10 mg, 10 mg, Oral, Daily, Schnier, Dolores Lory, MD, 10 mg at 08/11/21 0931 .  nitroGLYCERIN (NITROSTAT) SL tablet 0.4 mg, 0.4 mg, Sublingual, Q5 min PRN, Schnier, Dolores Lory, MD, 0.4 mg at 08/05/21 0852 .  ondansetron (ZOFRAN) injection 4 mg, 4 mg, Intravenous, Q8H PRN, Schnier, Dolores Lory, MD, 4 mg at 08/05/21 1641 .  ondansetron (ZOFRAN) injection 4 mg, 4 mg, Intravenous, Q6H PRN, Schnier, Dolores Lory, MD .  oxyCODONE-acetaminophen (PERCOCET/ROXICET) 5-325 MG per tablet 1 tablet, 1 tablet, Oral, Q4H PRN, Schnier, Dolores Lory, MD, 1 tablet at 08/10/21 [phone] .  sertraline (ZOLOFT) tablet 100 mg, 100 mg, Oral, Daily, Schnier, Dolores Lory, MD, 100 mg at 08/11/21 0930    ALLERGIES   Aspirin and Sulfonamide derivatives     REVIEW OF SYSTEMS    Review of Systems:  Gen:  Denies  fever, sweats, chills weigh loss  HEENT: Denies blurred vision, double vision, ear pain, eye pain, hearing loss, nose bleeds, sore throat Cardiac:  No dizziness, chest pain or heaviness, chest tightness,edema Resp:   Denies cough or sputum porduction, shortness of breath,wheezing, hemoptysis,  Gi: Denies swallowing difficulty, stomach pain, nausea or vomiting, diarrhea, constipation, bowel  incontinence Gu:  Denies bladder incontinence, burning urine Ext:   Denies Joint pain, stiffness or swelling Skin: Denies  skin rash, easy bruising or bleeding or hives Endoc:  Denies polyuria, polydipsia , polyphagia or weight change Psych:   Denies depression, insomnia or hallucinations   Other:  All other systems negative   VS: BP 133/85 (BP Location: Right Arm)   Pulse 96   Temp 98.1 F (36.7 C)   Resp 20   Ht _0  (1.778 m)   Wt 99.8 kg   SpO2 96%   BMI 31.57 kg/m      PHYSICAL EXAM    GENERAL:NAD, no fevers, chills, no weakness no fatigue HEAD: Normocephalic, atraumatic.  EYES: Pupils equal, round, reactive to light. Extraocular muscles intact. No scleral icterus.  MOUTH: Moist mucosal membrane. Dentition intact. No abscess noted.  EAR,  NOSE, THROAT: Clear without exudates. No external lesions.  NECK: Supple. No thyromegaly. No nodules. No JVD.  PULMONARY: bilateral rhonchi worse at left base  CARDIOVASCULAR: S1 and S2. Regular rate and rhythm. No murmurs, rubs, or gallops. No edema. Pedal pulses 2+ bilaterally.  GASTROINTESTINAL: Soft, nontender, nondistended. No masses. Positive bowel sounds. No hepatosplenomegaly.  MUSCULOSKELETAL: No swelling, clubbing, or edema. Range of motion full in all extremities.  NEUROLOGIC: Cranial nerves II through XII are intact. No gross focal neurological deficits. Sensation intact. Reflexes intact.  SKIN: No ulceration, lesions, rashes, or cyanosis. Skin warm and dry. Turgor intact.  PSYCHIATRIC: Mood, affect within normal limits. The patient is awake, alert and oriented x 3. Insight, judgment intact.       IMAGING    DG Chest 2 View  Result Date: 08/10/2021 CLINICAL DATA:  Shortness of breath in a 50 year old male. History of pulmonary embolism. EXAM: CHEST - 2 VIEW COMPARISON:  August 06, 2021. FINDINGS: Stable appearance of cardiomediastinal contours accounting for obscured cardiac borders on the LEFT due to persistent  LEFT-sided pleural effusion and basilar airspace disease. This appearance is unchanged compared to recent imaging. No pneumothorax. Patchy RIGHT basilar opacities likely atelectatic changes. Minimal RIGHT basilar opacities are similar to previous imaging. There is no visible pneumothorax. EKG leads project over the chest. On limited assessment there is no acute skeletal process. IMPRESSION: Stable appearance of the chest with LEFT lower lobe consolidative process and concomitant effusion and scattered RIGHT basilar opacities. Electronically Signed   By: Zetta Bills M.D.   On: 08/10/2021 14:00   DG Chest 2 View  Result Date: 08/06/2021 CLINICAL DATA:  50 year old male with shortness of breath, pulmonary embolism. EXAM: CHEST - 2 VIEW COMPARISON:  08/05/2021, 08/03/2021 FINDINGS: The mediastinal contours are partially obscured, unchanged. No cardiomegaly. Similar elevation left hemidiaphragm with left basilar consolidative opacity in obscuration of the left costophrenic angle. Subsegmental atelectasis in the right lung base. No acute osseous abnormality. IMPRESSION: Similar appearing left basilar opacity. Differential continues to include infarction, effusion, or mucous plugging. Repeat chest CT may be more illustratrative as clinically appropriate. Electronically Signed   By: Ruthann Cancer M.D.   On: 08/06/2021 15:32   CT CHEST WO CONTRAST  Result Date: 08/07/2021 CLINICAL DATA:  Shortness of breath and left-sided chest pain. EXAM: CT CHEST WITHOUT CONTRAST TECHNIQUE: Multidetector CT imaging of the chest was performed following the standard protocol without IV contrast. COMPARISON:  Chest radiograph dated 08/06/2021 and CT chest dated 08/03/2021. FINDINGS: Cardiovascular: No significant vascular findings. Normal heart size. No pericardial effusion. Mediastinum/Nodes: No enlarged mediastinal or axillary lymph nodes. Thyroid gland, trachea, and esophagus demonstrate no significant findings. Lungs/Pleura:  There are large left and small right pleural effusions with associated atelectasis, new since 08/03/2021. Moderate ground-glass opacities are seen in both upper lobes with areas of consolidation in the left lower and left upper lobes, new since 08/03/2021. There is no pneumothorax. Upper Abdomen: No acute abnormality. Musculoskeletal: No chest wall mass or suspicious bone lesions identified. IMPRESSION: 1. Large left and small right pleural effusions with associated atelectasis. 2. Moderate ground-glass opacities in both lungs may represent pneumonia. Electronically Signed   By: Zerita Boers M.D.   On: 08/07/2021 10:24   CT Angio Chest PE W/Cm &/Or Wo Cm  Result Date: 08/03/2021 CLINICAL DATA:  Evaluate for pulmonary embolus EXAM: CT ANGIOGRAPHY CHEST WITH CONTRAST TECHNIQUE: Multidetector CT imaging of the chest was performed using the standard protocol during bolus administration of intravenous contrast. Multiplanar CT  image reconstructions and MIPs were obtained to evaluate the vascular anatomy. CONTRAST:  30m OMNIPAQUE IOHEXOL 350 MG/ML SOLN COMPARISON:  Chest CT dated July 25, 2011 FINDINGS: Cardiovascular: Adequate opacification of the pulmonary arteries. Intraluminal filling defect of segmental and subsegmental left lower lobe pulmonary arteries. Normal heart size with no CT evidence of right heart strain. No pericardial effusion. No significant coronary artery calcifications or atherosclerotic disease of the thoracic aorta. Mediastinum/Nodes: Esophagus and thyroid are unremarkable. No pathologically enlarged lymph nodes seen in the chest. Lungs/Pleura: Central airways are patent. Left lower lobe linear opacities, likely due to scarring or atelectasis. No consolidation, pleural effusion or pneumothorax. Upper Abdomen: No acute abnormality. Musculoskeletal: No chest wall abnormality. No acute or significant osseous findings. Review of the MIP images confirms the above findings. IMPRESSION: Pulmonary  embolus of segmental and subsegmental left lower lobe pulmonary arteries. No CT evidence of right heart strain. Critical Value/emergent results were called by telephone at the time of interpretation on 08/03/2021 at 1:58 pm to provider IEllender Hose MD, who verbally acknowledged these results. Electronically Signed   By: LYetta GlassmanM.D.   On: 08/03/2021 13:59   [place] (PLEURAL EFFUSION)  Result Date: 08/09/2021 CLINICAL DATA:  Evaluate pleural fluid for thoracentesis EXAM: CHEST ULTRASOUND COMPARISON:  CT chest October 23 FINDINGS: Focused sonographic exam of the bilateral chest was performed. Submitted images demonstrate trace pleural fluid bilaterally. There is insufficient fluid to perform safe image guided thoracentesis. IMPRESSION: Focused sonographic exam of the bilateral chest demonstrates trace bilateral pleural effusions. Fluid volume is insufficient for safe image guided thoracentesis. When this study is used in conjunction with recent CT chest, it is felt like findings in the chest likely represent more consolidated lung, and less pleural fluid. Electronically Signed   By: YAlbin FellingM.D.   On: 08/09/2021 07:44   PERIPHERAL VASCULAR CATHETERIZATION  Result Date: 08/09/2021 See surgical note for result.  [place] Img Lower Bilateral (DVT)  Result Date: 08/03/2021 CLINICAL DATA:  Pulmonary embolism. EXAM: BILATERAL LOWER EXTREMITY VENOUS DOPPLER ULTRASOUND TECHNIQUE: Gray-scale sonography with graded compression, as well as color Doppler and duplex ultrasound were performed to evaluate the lower extremity deep venous systems from the level of the common femoral vein and including the common femoral, femoral, profunda femoral, popliteal and calf veins including the posterior tibial, peroneal and gastrocnemius veins when visible. The superficial great saphenous vein was also interrogated. Spectral Doppler was utilized to evaluate flow at rest and with distal augmentation maneuvers in the  common femoral, femoral and popliteal veins. COMPARISON:  None. FINDINGS: RIGHT LOWER EXTREMITY Common Femoral Vein: No evidence of thrombus. Normal compressibility, respiratory phasicity and response to augmentation. Saphenofemoral Junction: No evidence of thrombus. Normal compressibility and flow on color Doppler imaging. Profunda Femoral Vein: No evidence of thrombus. Normal compressibility and flow on color Doppler imaging. Femoral Vein: No evidence of thrombus. Normal compressibility, respiratory phasicity and response to augmentation. Popliteal Vein: No evidence of thrombus. Normal compressibility, respiratory phasicity and response to augmentation. Calf Veins: No evidence of thrombus. Normal compressibility and flow on color Doppler imaging. Superficial Great Saphenous Vein: No evidence of thrombus. Normal compressibility. Venous Reflux:  None. Other Findings: No evidence of superficial thrombophlebitis or abnormal fluid collection. LEFT LOWER EXTREMITY Common Femoral Vein: No evidence of thrombus. Normal compressibility, respiratory phasicity and response to augmentation. Saphenofemoral Junction: No evidence of thrombus. Normal compressibility and flow on color Doppler imaging. Profunda Femoral Vein: No evidence of thrombus. Normal compressibility and flow on color Doppler imaging.  Femoral Vein: No evidence of thrombus. Normal compressibility, respiratory phasicity and response to augmentation. Popliteal Vein: No evidence of thrombus. Normal compressibility, respiratory phasicity and response to augmentation. Calf Veins: No evidence of thrombus. Normal compressibility and flow on color Doppler imaging. Superficial Great Saphenous Vein: No evidence of thrombus. Normal compressibility. Venous Reflux:  None. Other Findings: There is thrombus noted in the left short saphenous vein behind the knee and extending into the calf. This thrombus does not visibly extend into the left popliteal vein. IMPRESSION: No  evidence of deep venous thrombosis in either lower extremity. Superficial thrombophlebitis noted in the left short saphenous vein. Electronically Signed   By: Aletta Edouard M.D.   On: 08/03/2021 16:59   US Venous Img Upper Bilat (DVT)  Result Date: 08/07/2021 CLINICAL DATA:  50 year old male with history of pulmonary embolism. EXAM: BILATERAL UPPER EXTREMITY VENOUS DOPPLER ULTRASOUND TECHNIQUE: Gray-scale sonography with graded compression, as well as color Doppler and duplex ultrasound were performed to evaluate the bilateral upper extremity deep venous systems from the level of the subclavian vein and including the jugular, axillary, basilic, radial, ulnar and upper cephalic vein. Spectral Doppler was utilized to evaluate flow at rest and with distal augmentation maneuvers. COMPARISON:  None. FINDINGS: RIGHT UPPER EXTREMITY Internal Jugular Vein: No evidence of thrombus. Normal compressibility, respiratory phasicity and response to augmentation. Subclavian Vein: No evidence of thrombus. Normal compressibility, respiratory phasicity and response to augmentation. Axillary Vein: No evidence of thrombus. Normal compressibility, respiratory phasicity and response to augmentation. Cephalic Vein: No evidence of thrombus. Normal compressibility, respiratory phasicity and response to augmentation. Basilic Vein: No evidence of thrombus. Normal compressibility, respiratory phasicity and response to augmentation. Brachial Veins: No evidence of thrombus. Normal compressibility, respiratory phasicity and response to augmentation. Radial Veins: No evidence of thrombus. Normal compressibility, respiratory phasicity and response to augmentation. Ulnar Veins: No evidence of thrombus. Normal compressibility, respiratory phasicity and response to augmentation. Other Findings:  None. LEFT UPPER EXTREMITY Internal Jugular Vein: No evidence of thrombus. Normal compressibility, respiratory phasicity and response to augmentation.  Subclavian Vein: No evidence of thrombus. Normal compressibility, respiratory phasicity and response to augmentation. Axillary Vein: No evidence of thrombus. Normal compressibility, respiratory phasicity and response to augmentation. Cephalic Vein: No evidence of thrombus. Normal compressibility, respiratory phasicity and response to augmentation. Basilic Vein: No evidence of thrombus. Normal compressibility, respiratory phasicity and response to augmentation. Brachial Veins: No evidence of thrombus. Normal compressibility, respiratory phasicity and response to augmentation. Radial Veins: No evidence of thrombus. Normal compressibility, respiratory phasicity and response to augmentation. Ulnar Veins: No evidence of thrombus. Normal compressibility, respiratory phasicity and response to augmentation. Other Findings:  None. IMPRESSION: No evidence of deep vein thrombosis within either upper extremity. Ruthann Cancer, MD [facility] Electronically Signed   By: Ruthann Cancer M.D.   On: 08/07/2021 10:50   DG Chest Port 1 View  Result Date: 08/05/2021 CLINICAL DATA:  50 year old male shortness of breath. Chest pain and diaphoresis. Positive left pulmonary embolus on recent CTA. EXAM: PORTABLE CHEST 1 VIEW COMPARISON:  CTA chest 08/03/2021 and earlier. FINDINGS: Portable AP upright view at 0905 hours. Lower lung volumes with increasing dense opacity at the left lung base most resembling pleural effusion. No pulmonary edema or pneumothorax. Visible mediastinal contours remain stable. Patchy increased right lung opacity more resembling atelectasis. No acute osseous abnormality identified. Negative visible bowel gas. IMPRESSION: Lower lung volumes with increasing dense opacification at the left lung base. Favor acute pleural effusion, although  pulmonary infarct not excluded in this setting. Increased right lung base atelectasis. Electronically Signed   By: Genevie Ann  M.D.   On: 08/05/2021 09:37          ASSESSMENT/PLAN     Acute on chronic hypoxemic respiratory failure  -S/p PE and with pleural effusion    -have discused with patient and family   - patient is on eliquis    Will check fluid studies including culture to evaluate for hemothorax vs empyema  - patient scheduled for thoracentesis today    Atelectasis of left lung  - Bronchopulmonary hygiene  -repeat chest imaging with less fluid and more atelectasis   Left lung bronchopneumonia - on zithromax and rocephin   Acute PE - on eliquis  - vascular surgery is on case plan for procedure today  Sickle Cell disease SF/SHP - patient has not had any treatment and has never been hospitalized for sickle cell disease     Thank you for allowing me to participate in the care of this patient.    Patient/Family are satisfied with care plan and all questions have been answered.   This document was prepared using Dragon voice recognition software and may include unintentional dictation errors.     Ottie Glazier, M.D.  Division of [place]

## 2021-08-12 NOTE — Discharge Summary (Signed)
Physician Discharge Summary  William Lin UJW:[id number] DOB: 12/14/1970 DOA: 08/03/2021  PCP: [facility] [place]  Admit date: 08/03/2021 Discharge date: 08/12/2021  Admitted From: Home Disposition: Home  Recommendations for Outpatient Follow-up:  Follow up with PCP in 1-2 weeks Continue Eliquis 5 mg p.o. twice daily for 3 months for acute pulmonary embolism Continue Tussionex as needed for cough Celebrex 100 mg p.o. twice daily x7 days for costochondritis/chest wall pain Albuterol MDI as needed for shortness of breath Consider outpatient referral to hematology for sickle cell disease  Home Health: No Equipment/Devices: None  Discharge Condition: Stable CODE STATUS: Full code Diet recommendation: Heart healthy diet  History of present illness:  My William Lin is a 50 year old male with past medical history significant for essential hypertension, type 2 diabetes mellitus, depression/anxiety, sickle cell anemia (mild variance without previous crisis per patient), who presented to [facility] ED on 10/19 with chest pain.  Patient reports long distance travel recently, flew to [place] a week ago and then [place] a month ago.  He also reported discomfort in his left calf area which resolved roughly 1 week ago.  Patient was seen in urgent care and found to have a positive D-dimer and sent to the ED for further evaluation.   In the ED, WBC count 10.2, troponin 3, D-dimer 1.36, afebrile, BP 162/104, HR 106, RR 27, SPO2 96% on room air.  CT angiogram chest notable for pulmonary embolism, segmental and subsegmental left lower lobe pulmonary arteries, no CT evidence of right heart strain.  Patient was started on IV heparin drip.  Duration consulted for further evaluation management.  Hospital course:  Acute hypoxic respiratory failure, POA Acute pulmonary embolism Patient presenting to the ED after elevated D-dimer at urgent care.  Recent prolonged travel with several flights  including to [place] in [place] over the last month.  Also reports recent left calf pain; that now has resolved.  CT angiogram chest notable for pulmonary embolism left lower lobe segmental and segment segmental pulmonary arteries.  Patient was initially started on heparin drip.  Pulmonology and vascular surgery were consulted and followed on hospital course.  Patient underwent injection of his left pulmonary arteries by Dr. Gilda Lin on 10/25 with findings of no flow-limiting thrombus within the left pulmonary artery, segmental or subsegmental branches and no thrombectomy was indicated.  Patient was weaned from supplemental oxygen with no desaturation on ambulation.  Continue Eliquis 5 mg p.o. twice daily x3 months.  Tussionex as needed for cough, Celebrex 100 mg p.o. twice daily x7 days for chest wall pain/costochondritis.  Outpatient follow-up with PCP.   Hypokalemia Hypomagnesemia Repleted during hospitalization.  Potassium 3.9 and magnesium 2.1 at time of discharge.   Atelectasis left lung Continue incentive spirometry, flutter valve on discharge   Left lung bronchopneumonia WBC count 15.1 on admission, improved to 9.3 at time of discharge.  Completed course of azithromycin/ceftriaxone.  Type 2 diabetes mellitus At home on metformin 1000 mg p.o. twice daily, pioglitazone 15 mg p.o. daily.  Hemoglobin A1c 6.8 on 08/03/2021; controlled.   Essential hypertension Continue lisinopril   Depression/anxiety: Sertraline 100 mg p.o. 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  Reticulocyte count and LDH within normal limits.  Outpatient follow-up with PCP, could consider outpatient referral to hematology.  Discharge Diagnoses:  Principal Problem:   Acute pulmonary embolism (HCC) Active Problems:   Diabetes mellitus without  complication (HCC)   Depression with anxiety   HTN (hypertension)   Pulmonary embolism [facility])    Discharge Instructions  Discharge Instructions     Call MD for:  difficulty breathing, headache or visual disturbances   Complete by: As directed    Call MD for:  extreme fatigue   Complete by: As directed    Call MD for:  persistant dizziness or light-headedness   Complete by: As directed    Call MD for:  persistant nausea and vomiting   Complete by: As directed    Call MD for:  severe uncontrolled pain   Complete by: As directed    Call MD for:  temperature >100.4   Complete by: As directed    Diet - low sodium heart healthy   Complete by: As directed    Increase activity slowly   Complete by: As directed       Allergies as of 08/12/2021       Reactions   Aspirin    Sulfonamide Derivatives         Medication List     STOP taking these medications    FLUoxetine 20 MG capsule Commonly known as: PROZAC       TAKE these medications    acetaminophen 500 MG tablet Commonly known as: TYLENOL Take 500 mg by mouth every 6 (six) hours as needed.   albuterol 108 (90 Base) MCG/ACT inhaler Commonly known as: VENTOLIN HFA Inhale 2 puffs into the lungs every 4 (four) hours as needed for wheezing or shortness of breath.   apixaban 5 MG Tabs tablet Commonly known as: ELIQUIS Take 1 tablet (5 mg total) by mouth 2 (two) times daily.   celecoxib 100 MG capsule Commonly known as: CeleBREX Take 1 capsule (100 mg total) by mouth 2 (two) times daily for 7 days.   chlorpheniramine-HYDROcodone 10-8 MG/5ML Suer Commonly known as: TUSSIONEX Take 5 mLs by mouth every 12 (twelve) hours as needed for cough.   ibuprofen 200 MG tablet Commonly known as: ADVIL Take 400 mg by mouth every 6 (six) hours as needed.   lisinopril 5 MG tablet Commonly known as: ZESTRIL Take 5 mg by mouth daily.   metFORMIN 500 MG 24 hr tablet Commonly known as: GLUCOPHAGE-XR Take 2 tablets by  mouth in the morning and at bedtime. What changed: Another medication with the same name was removed. Continue taking this medication, and follow the directions you see here.   pioglitazone 15 MG tablet Commonly known as: ACTOS Take 15 mg by mouth daily.   sertraline 100 MG tablet Commonly known as: ZOLOFT Take 100 mg by mouth daily.        Follow-up Information     [facility] [place]. Schedule an appointment as soon as possible for a visit in 1 week(s).   Specialty: Family Medicine Contact information: [address] [place] [address] [phone]                Allergies  Allergen Reactions   Aspirin    Sulfonamide Derivatives     Consultations: Pulmonary critical care medicine Vascular surgery   Procedures/Studies: DG Chest 2 View  Result Date: 08/10/2021 CLINICAL DATA:  Shortness of breath in a 50 year old male. History of pulmonary embolism. EXAM: CHEST - 2 VIEW COMPARISON:  August 06, 2021. FINDINGS: Stable appearance of cardiomediastinal contours accounting for obscured cardiac borders on the LEFT due to persistent LEFT-sided pleural effusion and basilar airspace disease. This appearance is unchanged  compared to recent imaging. No pneumothorax. Patchy RIGHT basilar opacities likely atelectatic changes. Minimal RIGHT basilar opacities are similar to previous imaging. There is no visible pneumothorax. EKG leads project over the chest. On limited assessment there is no acute skeletal process. IMPRESSION: Stable appearance of the chest with LEFT lower lobe consolidative process and concomitant effusion and scattered RIGHT basilar opacities. Electronically Signed   By: Donzetta Kohut M.D.   On: 08/10/2021 14:00   DG Chest 2 View  Result Date: 08/06/2021 CLINICAL DATA:  50 year old male with shortness of breath, pulmonary embolism. EXAM: CHEST - 2 VIEW COMPARISON:  08/05/2021, 08/03/2021 FINDINGS: The mediastinal contours are  partially obscured, unchanged. No cardiomegaly. Similar elevation left hemidiaphragm with left basilar consolidative opacity in obscuration of the left costophrenic angle. Subsegmental atelectasis in the right lung base. No acute osseous abnormality. IMPRESSION: Similar appearing left basilar opacity. Differential continues to include infarction, effusion, or mucous plugging. Repeat chest CT may be more illustratrative as clinically appropriate. Electronically Signed   By: Marliss Coots M.D.   On: 08/06/2021 15:32   CT CHEST WO CONTRAST  Result Date: 08/07/2021 CLINICAL DATA:  Shortness of breath and left-sided chest pain. EXAM: CT CHEST WITHOUT CONTRAST TECHNIQUE: Multidetector CT imaging of the chest was performed following the standard protocol without IV contrast. COMPARISON:  Chest radiograph dated 08/06/2021 and CT chest dated 08/03/2021. FINDINGS: Cardiovascular: No significant vascular findings. Normal heart size. No pericardial effusion. Mediastinum/Nodes: No enlarged mediastinal or axillary lymph nodes. Thyroid gland, trachea, and esophagus demonstrate no significant findings. Lungs/Pleura: There are large left and small right pleural effusions with associated atelectasis, new since 08/03/2021. Moderate ground-glass opacities are seen in both upper lobes with areas of consolidation in the left lower and left upper lobes, new since 08/03/2021. There is no pneumothorax. Upper Abdomen: No acute abnormality. Musculoskeletal: No chest wall mass or suspicious bone lesions identified. IMPRESSION: 1. Large left and small right pleural effusions with associated atelectasis. 2. Moderate ground-glass opacities in both lungs may represent pneumonia. Electronically Signed   By: Romona Curls M.D.   On: 08/07/2021 10:24   CT Angio Chest PE W/Cm &/Or Wo Cm  Result Date: 08/03/2021 CLINICAL DATA:  Evaluate for pulmonary embolus EXAM: CT ANGIOGRAPHY CHEST WITH CONTRAST TECHNIQUE: Multidetector CT imaging of the  chest was performed using the standard protocol during bolus administration of intravenous contrast. Multiplanar CT image reconstructions and MIPs were obtained to evaluate the vascular anatomy. CONTRAST:  75mL OMNIPAQUE IOHEXOL 350 MG/ML SOLN COMPARISON:  Chest CT dated July 25, 2011 FINDINGS: Cardiovascular: Adequate opacification of the pulmonary arteries. Intraluminal filling defect of segmental and subsegmental left lower lobe pulmonary arteries. Normal heart size with no CT evidence of right heart strain. No pericardial effusion. No significant coronary artery calcifications or atherosclerotic disease of the thoracic aorta. Mediastinum/Nodes: Esophagus and thyroid are unremarkable. No pathologically enlarged lymph nodes seen in the chest. Lungs/Pleura: Central airways are patent. Left lower lobe linear opacities, likely due to scarring or atelectasis. No consolidation, pleural effusion or pneumothorax. Upper Abdomen: No acute abnormality. Musculoskeletal: No chest wall abnormality. No acute or significant osseous findings. Review of the MIP images confirms the above findings. IMPRESSION: Pulmonary embolus of segmental and subsegmental left lower lobe pulmonary arteries. No CT evidence of right heart strain. Critical Value/emergent results were called by telephone at the time of interpretation on 08/03/2021 at 1:58 pm to provider Erma Heritage, MD, who verbally acknowledged these results. Electronically Signed   By: Allegra Lai M.D.   On:  08/03/2021 13:59   [place] CHEST (PLEURAL EFFUSION)  Result Date: 08/09/2021 CLINICAL DATA:  Evaluate pleural fluid for thoracentesis EXAM: CHEST ULTRASOUND COMPARISON:  CT chest October 23 FINDINGS: Focused sonographic exam of the bilateral chest was performed. Submitted images demonstrate trace pleural fluid bilaterally. There is insufficient fluid to perform safe image guided thoracentesis. IMPRESSION: Focused sonographic exam of the bilateral chest demonstrates trace  bilateral pleural effusions. Fluid volume is insufficient for safe image guided thoracentesis. When this study is used in conjunction with recent CT chest, it is felt like findings in the chest likely represent more consolidated lung, and less pleural fluid. Electronically Signed   By: Olive Bass M.D.   On: 08/09/2021 07:44   PERIPHERAL VASCULAR CATHETERIZATION  Result Date: 08/09/2021 See surgical note for result.  US Venous Img Lower Bilateral (DVT)  Result Date: 08/03/2021 CLINICAL DATA:  Pulmonary embolism. EXAM: BILATERAL LOWER EXTREMITY VENOUS DOPPLER ULTRASOUND TECHNIQUE: Gray-scale sonography with graded compression, as well as color Doppler and duplex ultrasound were performed to evaluate the lower extremity deep venous systems from the level of the common femoral vein and including the common femoral, femoral, profunda femoral, popliteal and calf veins including the posterior tibial, peroneal and gastrocnemius veins when visible. The superficial great saphenous vein was also interrogated. Spectral Doppler was utilized to evaluate flow at rest and with distal augmentation maneuvers in the common femoral, femoral and popliteal veins. COMPARISON:  None. FINDINGS: RIGHT LOWER EXTREMITY Common Femoral Vein: No evidence of thrombus. Normal compressibility, respiratory phasicity and response to augmentation. Saphenofemoral Junction: No evidence of thrombus. Normal compressibility and flow on color Doppler imaging. Profunda Femoral Vein: No evidence of thrombus. Normal compressibility and flow on color Doppler imaging. Femoral Vein: No evidence of thrombus. Normal compressibility, respiratory phasicity and response to augmentation. Popliteal Vein: No evidence of thrombus. Normal compressibility, respiratory phasicity and response to augmentation. Calf Veins: No evidence of thrombus. Normal compressibility and flow on color Doppler imaging. Superficial Great Saphenous Vein: No evidence of thrombus.  Normal compressibility. Venous Reflux:  None. Other Findings: No evidence of superficial thrombophlebitis or abnormal fluid collection. LEFT LOWER EXTREMITY Common Femoral Vein: No evidence of thrombus. Normal compressibility, respiratory phasicity and response to augmentation. Saphenofemoral Junction: No evidence of thrombus. Normal compressibility and flow on color Doppler imaging. Profunda Femoral Vein: No evidence of thrombus. Normal compressibility and flow on color Doppler imaging. Femoral Vein: No evidence of thrombus. Normal compressibility, respiratory phasicity and response to augmentation. Popliteal Vein: No evidence of thrombus. Normal compressibility, respiratory phasicity and response to augmentation. Calf Veins: No evidence of thrombus. Normal compressibility and flow on color Doppler imaging. Superficial Great Saphenous Vein: No evidence of thrombus. Normal compressibility. Venous Reflux:  None. Other Findings: There is thrombus noted in the left short saphenous vein behind the knee and extending into the calf. This thrombus does not visibly extend into the left popliteal vein. IMPRESSION: No evidence of deep venous thrombosis in either lower extremity. Superficial thrombophlebitis noted in the left short saphenous vein. Electronically Signed   By: Irish Lack M.D.   On: 08/03/2021 16:59   US Venous Img Upper Bilat (DVT)  Result Date: 08/07/2021 CLINICAL DATA:  50 year old male with history of pulmonary embolism. EXAM: BILATERAL UPPER EXTREMITY VENOUS DOPPLER ULTRASOUND TECHNIQUE: Gray-scale sonography with graded compression, as well as color Doppler and duplex ultrasound were performed to evaluate the bilateral upper extremity deep venous systems from the level of the subclavian vein and including the jugular, axillary, basilic, radial, ulnar and upper  cephalic vein. Spectral Doppler was utilized to evaluate flow at rest and with distal augmentation maneuvers. COMPARISON:  None. FINDINGS:  RIGHT UPPER EXTREMITY Internal Jugular Vein: No evidence of thrombus. Normal compressibility, respiratory phasicity and response to augmentation. Subclavian Vein: No evidence of thrombus. Normal compressibility, respiratory phasicity and response to augmentation. Axillary Vein: No evidence of thrombus. Normal compressibility, respiratory phasicity and response to augmentation. Cephalic Vein: No evidence of thrombus. Normal compressibility, respiratory phasicity and response to augmentation. Basilic Vein: No evidence of thrombus. Normal compressibility, respiratory phasicity and response to augmentation. Brachial Veins: No evidence of thrombus. Normal compressibility, respiratory phasicity and response to augmentation. Radial Veins: No evidence of thrombus. Normal compressibility, respiratory phasicity and response to augmentation. Ulnar Veins: No evidence of thrombus. Normal compressibility, respiratory phasicity and response to augmentation. Other Findings:  None. LEFT UPPER EXTREMITY Internal Jugular Vein: No evidence of thrombus. Normal compressibility, respiratory phasicity and response to augmentation. Subclavian Vein: No evidence of thrombus. Normal compressibility, respiratory phasicity and response to augmentation. Axillary Vein: No evidence of thrombus. Normal compressibility, respiratory phasicity and response to augmentation. Cephalic Vein: No evidence of thrombus. Normal compressibility, respiratory phasicity and response to augmentation. Basilic Vein: No evidence of thrombus. Normal compressibility, respiratory phasicity and response to augmentation. Brachial Veins: No evidence of thrombus. Normal compressibility, respiratory phasicity and response to augmentation. Radial Veins: No evidence of thrombus. Normal compressibility, respiratory phasicity and response to augmentation. Ulnar Veins: No evidence of thrombus. Normal compressibility, respiratory phasicity and response to augmentation. Other  Findings:  None. IMPRESSION: No evidence of deep vein thrombosis within either upper extremity. Marliss Coots, MD [facility] Electronically Signed   By: Marliss Coots M.D.   On: 08/07/2021 10:50   DG Chest Port 1 View  Result Date: 08/05/2021 CLINICAL DATA:  50 year old male shortness of breath. Chest pain and diaphoresis. Positive left pulmonary embolus on recent CTA. EXAM: PORTABLE CHEST 1 VIEW COMPARISON:  CTA chest 08/03/2021 and earlier. FINDINGS: Portable AP upright view at 0905 hours. Lower lung volumes with increasing dense opacity at the left lung base most resembling pleural effusion. No pulmonary edema or pneumothorax. Visible mediastinal contours remain stable. Patchy increased right lung opacity more resembling atelectasis. No acute osseous abnormality identified. Negative visible bowel gas. IMPRESSION: Lower lung volumes with increasing dense opacification at the left lung base. Favor acute pleural effusion, although pulmonary infarct not excluded in this setting. Increased right lung base atelectasis. Electronically Signed   By: Odessa Fleming M.D.   On: 08/05/2021 09:37     Subjective: Patient seen examined at bedside, resting comfortably.  Spouse present.  No specific complaints this morning.  Remains off of oxygen, no further desaturations on ambulation.  Ready for discharge home.  Denies headache, no visual changes, no fever/chills/night sweats, no nausea/vomiting/diarrhea, no chest pain, no palpitations, no shortness of breath, no abdominal pain, no weakness, no fatigue, no paresthesias.  No acute events overnight per nursing staff.  Discharge Exam: Vitals:   08/12/21 0742 08/12/21 0827  BP: 134/81   Pulse: 85   Resp: 16   Temp: 98.1 F (36.7 C)   SpO2: 96% 95%   Vitals:   08/12/21 0224 08/12/21 0507 08/12/21 0742 08/12/21 0827  BP:  133/85 134/81   Pulse:  96 85   Resp:  20 16   Temp:  98.1 F (36.7 C) 98.1 F (36.7  C)   TempSrc:      SpO2: 96% 96% 96% 95%  Weight:  Height:        General: Pt is alert, awake, not in acute distress Cardiovascular: RRR, S1/S2 +, no rubs, no gallops Respiratory: CTA bilaterally, no wheezing, no rhonchi, on room air Abdominal: Soft, NT, ND, bowel sounds + Extremities: no edema, no cyanosis    The results of significant diagnostics from this hospitalization (including imaging, microbiology, ancillary and laboratory) are listed below for reference.     Microbiology: Recent Results (from the past 240 hour(s))  Resp Panel by RT-PCR (Flu A&B, Covid) Nasopharyngeal Swab     Status: None   Collection Time: 08/03/21  3:25 PM   Specimen: Nasopharyngeal Swab; Nasopharyngeal(NP) swabs in vial transport medium  Result Value Ref Range Status   SARS Coronavirus 2 by RT PCR NEGATIVE NEGATIVE Final    Comment: (NOTE) SARS-CoV-2 target nucleic acids are NOT DETECTED.  The SARS-CoV-2 RNA is generally detectable in upper respiratory specimens during the acute phase of infection. The lowest concentration of SARS-CoV-2 viral copies this assay can detect is 138 copies/mL. A negative result does not preclude SARS-Cov-2 infection and should not be used as the sole basis for treatment or other patient management decisions. A negative result may occur with  improper specimen collection/handling, submission of specimen other than nasopharyngeal swab, presence of viral mutation(s) within the areas targeted by this assay, and inadequate number of viral copies(<138 copies/mL). A negative result must be combined with clinical observations, patient history, and epidemiological information. The expected result is Negative.  Fact Sheet for Patients:  BloggerCourse.com  Fact Sheet for Healthcare Providers:  SeriousBroker.it  This test is no t yet approved or cleared by the [place] FDA and  has been authorized for detection  and/or diagnosis of SARS-CoV-2 by FDA under an Emergency Use Authorization (EUA). This EUA will remain  in effect (meaning this test can be used) for the duration of the COVID-19 declaration under Section 564(b)(1) of the Act, 21 U.S.C.section 360bbb-3(b)(1), unless the authorization is terminated  or revoked sooner.       Influenza A by PCR NEGATIVE NEGATIVE Final   Influenza B by PCR NEGATIVE NEGATIVE Final    Comment: (NOTE) The Xpert Xpress SARS-CoV-2/FLU/RSV plus assay is intended as an aid in the diagnosis of influenza from Nasopharyngeal swab specimens and should not be used as a sole basis for treatment. Nasal washings and aspirates are unacceptable for Xpert Xpress SARS-CoV-2/FLU/RSV testing.  Fact Sheet for Patients: BloggerCourse.com  Fact Sheet for Healthcare Providers: SeriousBroker.it  This test is not yet approved or cleared by the [place] FDA and has been authorized for detection and/or diagnosis of SARS-CoV-2 by FDA under an Emergency Use Authorization (EUA). This EUA will remain in effect (meaning this test can be used) for the duration of the COVID-19 declaration under Section 564(b)(1) of the Act, 21 U.S.C. section 360bbb-3(b)(1), unless the authorization is terminated or revoked.  Performed at [facility], [address] [place], [place] [address]      Labs: BNP (last 3 results) No results for input(s): BNP in the last 8760 hours. Basic Metabolic Panel: Recent Labs  Lab 08/07/21 1052 08/09/21 0447 08/10/21 0438 08/11/21 0614 08/12/21 0632  NA 136 139 139 139 136  K 3.2* 2.8* 3.4* 3.3* 3.9  CL 96* 100 104 103 104  CO2 GLUCOSE 279* 181* 131* 130* 106*  BUN CREATININE 0.62 0.75 0.66 0.78 0.76  CALCIUM 9.2 9.1 8.7* 8.7* 8.6*  MG  --  1.7  --  1.8 2.1   Liver Function Tests: Recent Labs  Lab 08/11/21 0614  AST 21  ALT 20  ALKPHOS 65  BILITOT  1.3*  PROT 7.1  ALBUMIN 2.8*   No results for input(s): LIPASE, AMYLASE in the last 168 hours. No results for input(s): AMMONIA in the last 168 hours. CBC: Recent Labs  Lab 08/06/21 0941 08/07/21 1052 08/08/21 0900 08/09/21 0447 08/10/21 0438 08/11/21 0614  WBC 15.1* 16.2* 12.9* 12.1* 11.3* 9.3  NEUTROABS 12.0*  --  9.8*  --   --  6.6  HGB 10.1* 10.3* 9.7* 9.0* 8.5* 8.5*  HCT 27.9* 28.0* 26.6* 25.2* 24.1* 23.0*  MCV 71.2* 69.1* 69.5* 67.9* 69.3* 68.9*  PLT 161 200 200 203 214 225   Cardiac Enzymes: No results for input(s): CKTOTAL, CKMB, CKMBINDEX, TROPONINI in the last 168 hours. BNP: Invalid input(s): POCBNP CBG: Recent Labs  Lab 08/11/21 0754 08/11/21 1148 08/11/21 1613 08/11/21 2021 08/12/21 0742  GLUCAP 112* 159* 194* 137* 102*   D-Dimer No results for input(s): DDIMER in the last 72 hours. Hgb A1c No results for input(s): HGBA1C in the last 72 hours. Lipid Profile No results for input(s): CHOL, HDL, LDLCALC, TRIG, CHOLHDL, LDLDIRECT in the last 72 hours. Thyroid function studies No results for input(s): TSH, T4TOTAL, T3FREE, THYROIDAB in the last 72 hours.  Invalid input(s): FREET3 Anemia work up Recent Labs    08/11/21 0614  RETICCTPCT 2.8   Urinalysis    Component Value Date/Time   COLORURINE YELLOW 10/16/2009 2206   APPEARANCEUR CLEAR 10/16/2009 2206   LABSPEC 1.019 10/16/2009 2206   PHURINE 6.0 10/16/2009 2206   GLUCOSEU NEGATIVE 10/16/2009 2206   HGBUR NEGATIVE 10/16/2009 2206   BILIRUBINUR NEGATIVE 10/16/2009 2206   KETONESUR NEGATIVE 10/16/2009 2206   PROTEINUR NEGATIVE 10/16/2009 2206   UROBILINOGEN 1.0 10/16/2009 2206   NITRITE NEGATIVE 10/16/2009 2206   LEUKOCYTESUR  10/16/2009 2206    NEGATIVE MICROSCOPIC NOT DONE ON URINES WITH NEGATIVE PROTEIN, BLOOD, LEUKOCYTES, NITRITE, OR GLUCOSE <1000 mg/dL.   Sepsis Labs Invalid input(s): PROCALCITONIN,  WBC,  LACTICIDVEN Microbiology Recent Results (from the past 240 hour(s))  Resp  Panel by RT-PCR (Flu A&B, Covid) Nasopharyngeal Swab     Status: None   Collection Time: 08/03/21  3:25 PM   Specimen: Nasopharyngeal Swab; Nasopharyngeal(NP) swabs in vial transport medium  Result Value Ref Range Status   SARS Coronavirus 2 by RT PCR NEGATIVE NEGATIVE Final    Comment: (NOTE) SARS-CoV-2 target nucleic acids are NOT DETECTED.  The SARS-CoV-2 RNA is generally detectable in upper respiratory specimens during the acute phase of infection. The lowest concentration of SARS-CoV-2 viral copies this assay can detect is 138 copies/mL. A negative result does not preclude SARS-Cov-2 infection and should not be used as the sole basis for treatment or other patient management decisions. A negative result may occur with  improper specimen collection/handling, submission of specimen other than nasopharyngeal swab, presence of viral mutation(s) within the areas targeted by this assay, and inadequate number of viral copies(<138 copies/mL). A negative result must be combined with clinical observations, patient history, and epidemiological information. The expected result is Negative.  Fact Sheet for Patients:  BloggerCourse.com  Fact Sheet for Healthcare Providers:  SeriousBroker.it  This test is no t yet approved or cleared by the [place] FDA and  has been authorized for detection and/or diagnosis of SARS-CoV-2 by FDA under an Emergency Use Authorization (EUA). This EUA will remain  in effect (meaning this test  can be used) for the duration of the COVID-19 declaration under Section 564(b)(1) of the Act, 21 U.S.C.section 360bbb-3(b)(1), unless the authorization is terminated  or revoked sooner.       Influenza A by PCR NEGATIVE NEGATIVE Final   Influenza B by PCR NEGATIVE NEGATIVE Final    Comment: (NOTE) The Xpert Xpress SARS-CoV-2/FLU/RSV plus assay is intended as an aid in the diagnosis of influenza from Nasopharyngeal  swab specimens and should not be used as a sole basis for treatment. Nasal washings and aspirates are unacceptable for Xpert Xpress SARS-CoV-2/FLU/RSV testing.  Fact Sheet for Patients: BloggerCourse.com  Fact Sheet for Healthcare Providers: SeriousBroker.it  This test is not yet approved or cleared by the [place] FDA and has been authorized for detection and/or diagnosis of SARS-CoV-2 by FDA under an Emergency Use Authorization (EUA). This EUA will remain in effect (meaning this test can be used) for the duration of the COVID-19 declaration under Section 564(b)(1) of the Act, 21 U.S.C. section 360bbb-3(b)(1), unless the authorization is terminated or revoked.  Performed at [facility], [address], [place], [place] [address]      Time coordinating discharge: Over 30 minutes  SIGNED:   Alvira Philips [place], DO  [facility] 08/12/2021, 10:04 AM

## 2022-08-14 ENCOUNTER — Encounter (INDEPENDENT_AMBULATORY_CARE_PROVIDER_SITE_OTHER): Payer: Self-pay

## 2023-08-17 ENCOUNTER — Emergency Department
Admission: EM | Admit: 2023-08-17 | Discharge: 2023-08-17 | Discharge status: Against Medical Advice | Payer: 59 | Attending: Emergency Medicine | Admitting: Emergency Medicine

## 2023-08-17 DIAGNOSIS — Z5321 Procedure and treatment not carried out due to patient leaving prior to being seen by health care provider: Secondary | ICD-10-CM | POA: Diagnosis not present

## 2023-08-17 DIAGNOSIS — M25521 Pain in right elbow: Secondary | ICD-10-CM | POA: Diagnosis not present

## 2023-08-17 DIAGNOSIS — R519 Headache, unspecified: Secondary | ICD-10-CM | POA: Diagnosis present

## 2023-08-17 NOTE — ED Triage Notes (Signed)
Pt was at a job site working when he had a witnessed fall and then started to shake for approx 5 mins. Witnessed stated he did not hit head. No loss of bowel or bladder and did not bite tongue. Pt does not have a hx of seizures. Pt states he landed on right elbow and has abrasion on elbow. Pt states he has been going through a lot of stress and thinks this was an anxiety event. Pt complains of slight headache 1/10 and right elbow 4/10

## 2023-09-16 IMAGING — US US EXTREM  UP VENOUS BILAT
1 series · 13 of 24 positions shown · non-contrast
Comparison: None.

CLINICAL DATA: 50-year-old male with history of pulmonary embolism.

EXAM:
BILATERAL UPPER EXTREMITY VENOUS DOPPLER ULTRASOUND
TECHNIQUE: Gray-scale sonography with graded compression, as well as color
Doppler and duplex ultrasound were performed to evaluate the
bilateral upper extremity deep venous systems from the level of the
subclavian vein and including the jugular, axillary, basilic,
radial, ulnar and upper cephalic vein. Spectral Doppler was utilized
to evaluate flow at rest and with distal augmentation maneuvers.

[Series 1: us venous img upper bilat (dvt) · portal-venous · 13 of 55 slices shown]
[im 1/55]
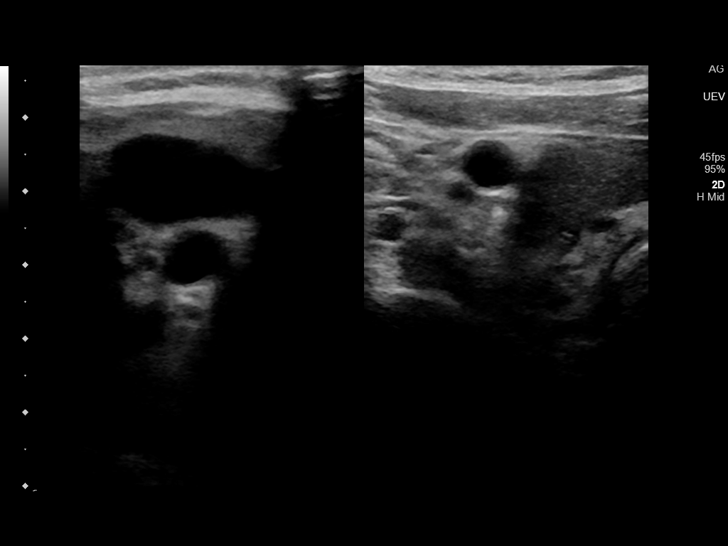
[im 5/55]
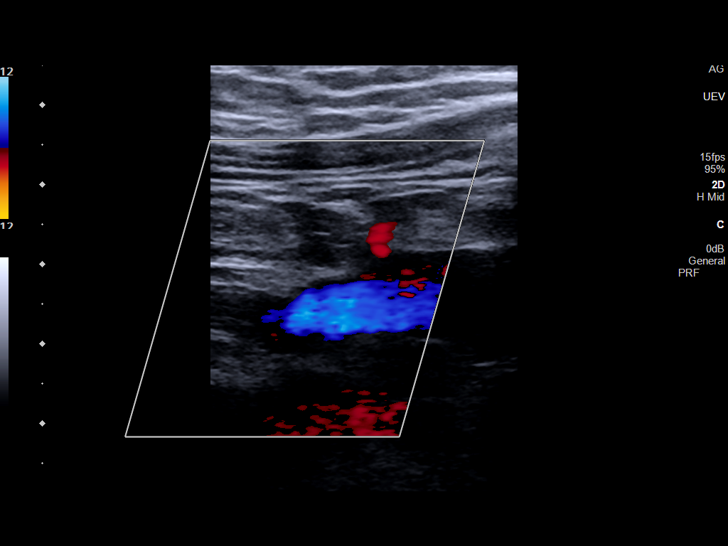
[im 10/55]
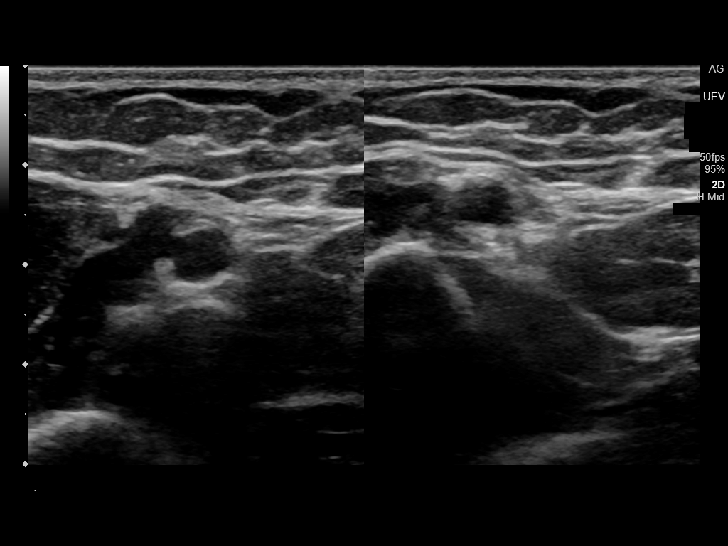
[im 15/55]
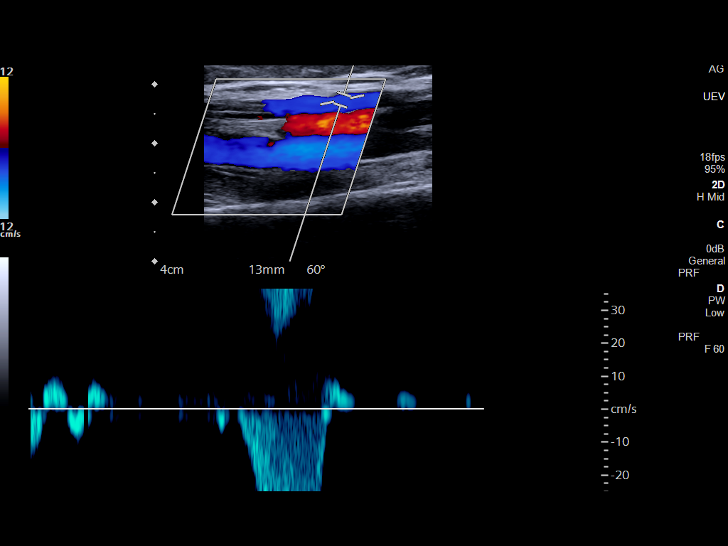
[im 19/55]
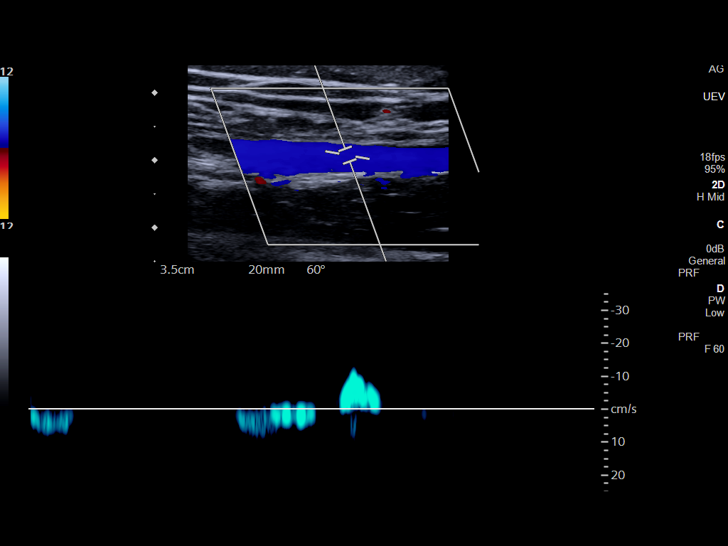
[im 24/55]
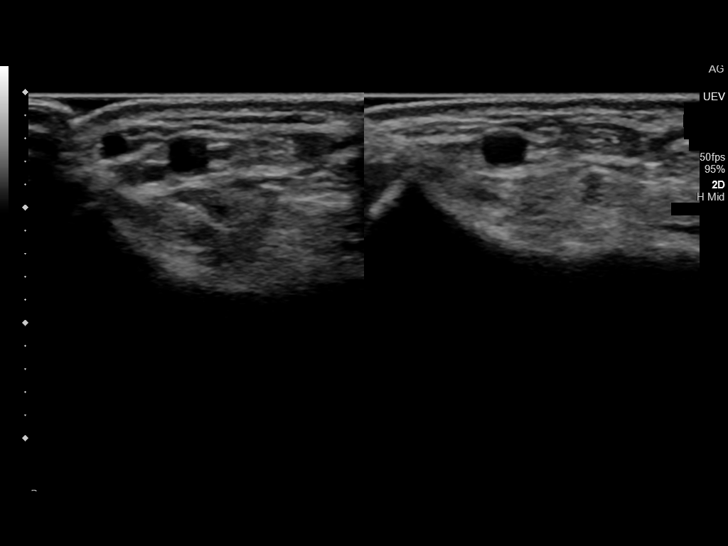
[im 29/55]
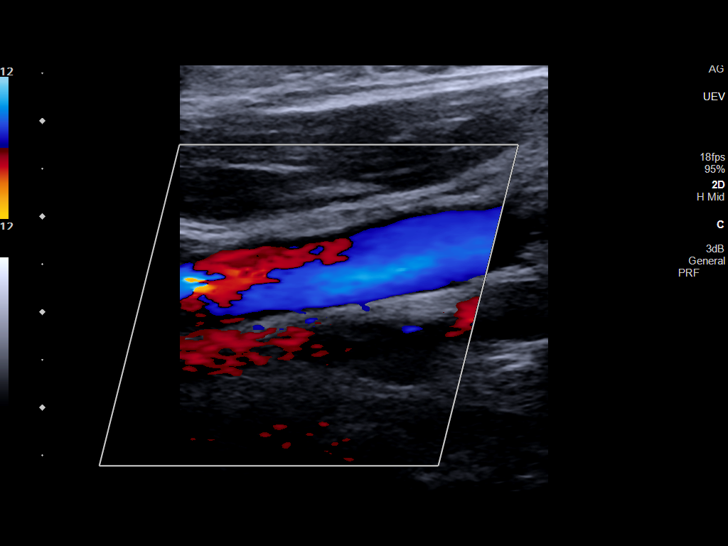
[im 31/55]
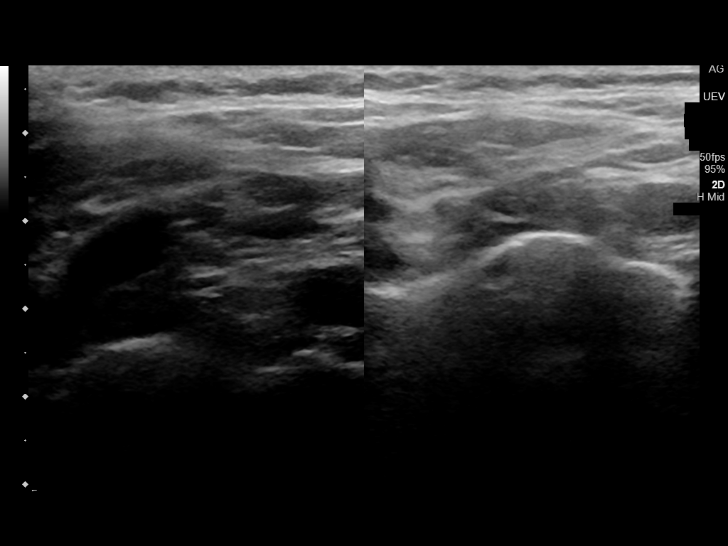
[im 36/55]
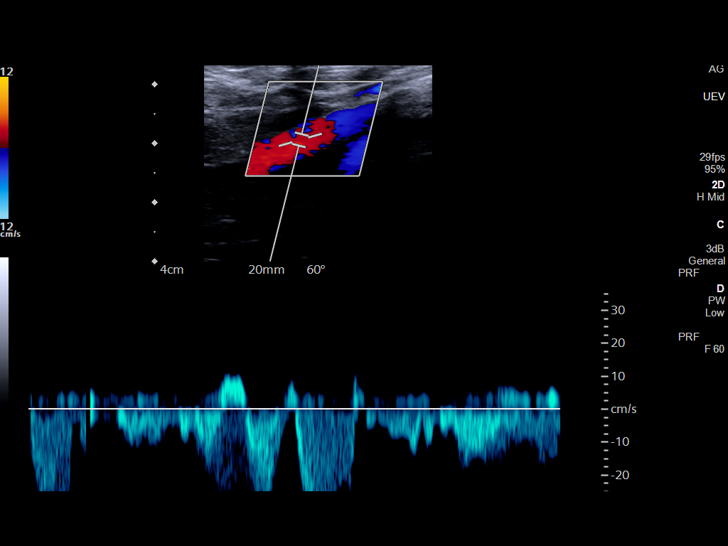
[im 40/55]
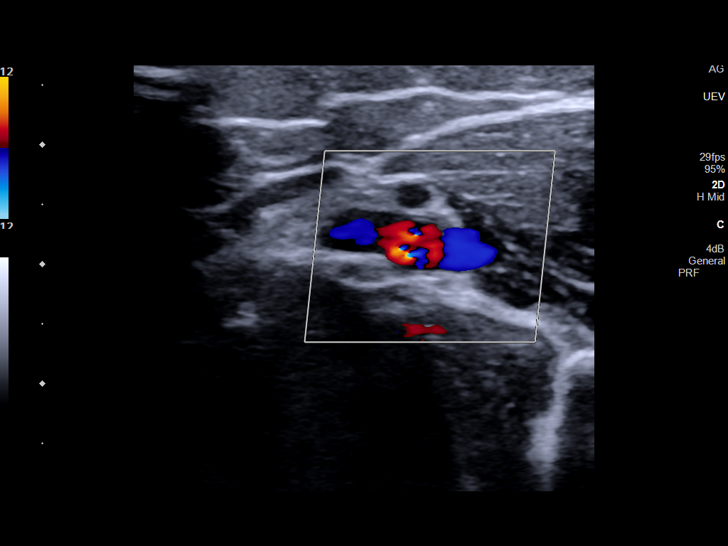
[im 45/55]
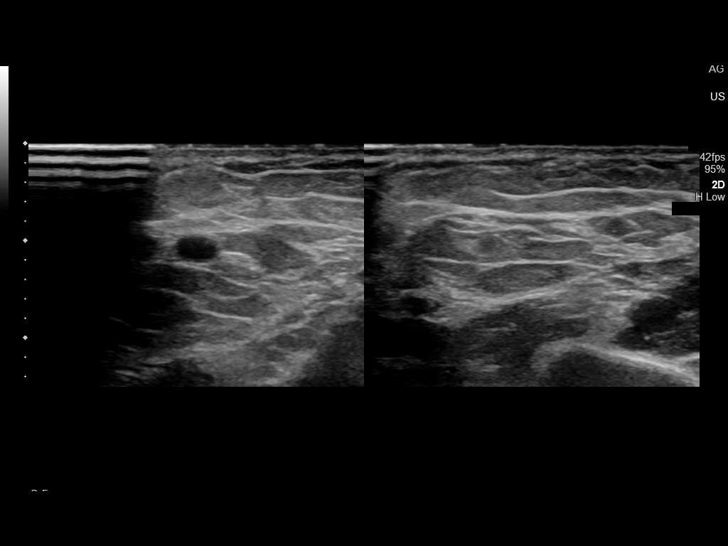
[im 50/55]
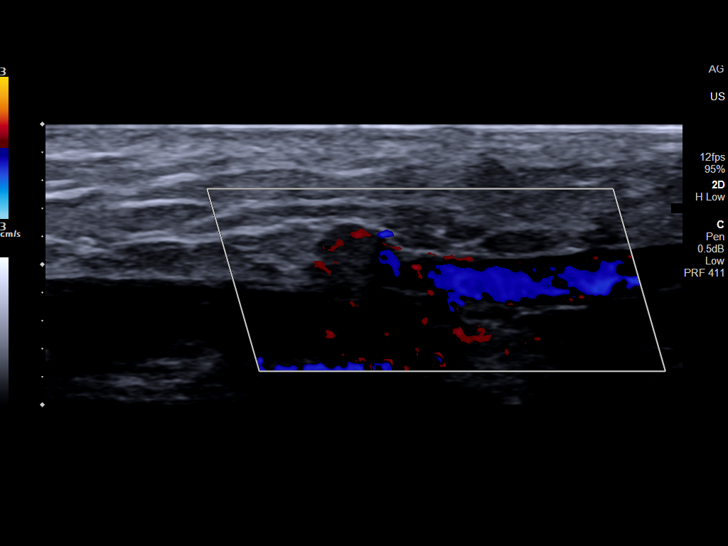
[im 55/55]
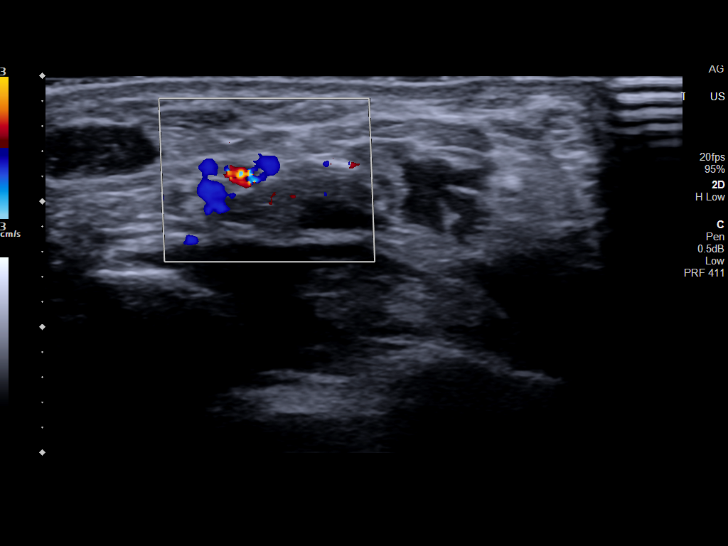

[13 of 24 positions shown; findings below may reference images not displayed]

FINDINGS: RIGHT UPPER EXTREMITY

Internal Jugular Vein: No evidence of thrombus. Normal
compressibility, respiratory phasicity and response to augmentation.

Subclavian Vein: No evidence of thrombus. Normal compressibility,
respiratory phasicity and response to augmentation.

Axillary Vein: No evidence of thrombus. Normal compressibility,
respiratory phasicity and response to augmentation.

Cephalic Vein: No evidence of thrombus. Normal compressibility,
respiratory phasicity and response to augmentation.

Basilic Vein: No evidence of thrombus. Normal compressibility,
respiratory phasicity and response to augmentation.

Brachial Veins: No evidence of thrombus. Normal compressibility,
respiratory phasicity and response to augmentation.

Radial Veins: No evidence of thrombus. Normal compressibility,
respiratory phasicity and response to augmentation.

Ulnar Veins: No evidence of thrombus. Normal compressibility,
respiratory phasicity and response to augmentation.

Other Findings:  None.

LEFT UPPER EXTREMITY

Internal Jugular Vein: No evidence of thrombus. Normal
compressibility, respiratory phasicity and response to augmentation.

Subclavian Vein: No evidence of thrombus. Normal compressibility,
respiratory phasicity and response to augmentation.

Axillary Vein: No evidence of thrombus. Normal compressibility,
respiratory phasicity and response to augmentation.

Cephalic Vein: No evidence of thrombus. Normal compressibility,
respiratory phasicity and response to augmentation.

Basilic Vein: No evidence of thrombus. Normal compressibility,
respiratory phasicity and response to augmentation.

Brachial Veins: No evidence of thrombus. Normal compressibility,
respiratory phasicity and response to augmentation.

Radial Veins: No evidence of thrombus. Normal compressibility,
respiratory phasicity and response to augmentation.

Ulnar Veins: No evidence of thrombus. Normal compressibility,
respiratory phasicity and response to augmentation.

Other Findings:  None.
IMPRESSION: No evidence of deep vein thrombosis within either upper extremity.

## 2023-09-16 IMAGING — CT CT CHEST W/O CM
2 of 3 series · 15 of 36 positions shown, 18 images · non-contrast
Comparison: Chest radiograph dated 08/06/2021 and CT chest dated
08/03/2021.

CLINICAL DATA: Shortness of breath and left-sided chest pain.

EXAM:
CT CHEST WITHOUT CONTRAST
TECHNIQUE: Multidetector CT imaging of the chest was performed following the
standard protocol without IV contrast.

[Series 2: thorax · axial · 0.83mm/px · z∈[-517,-265]mm · 12 of 148 slices shown, 15 images]
[im 11/148  mediastinal]
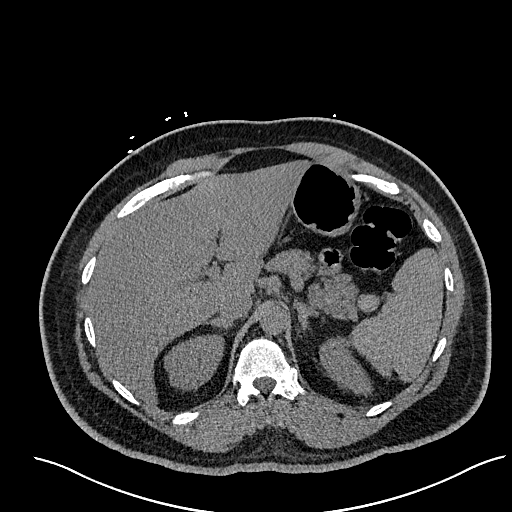
[im 11/148  lung]
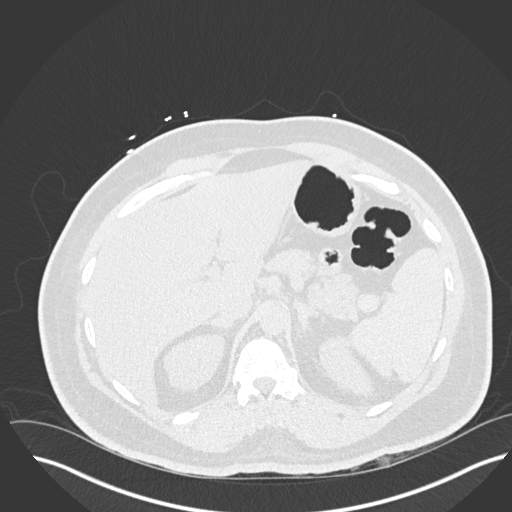
[im 22/148  lung]
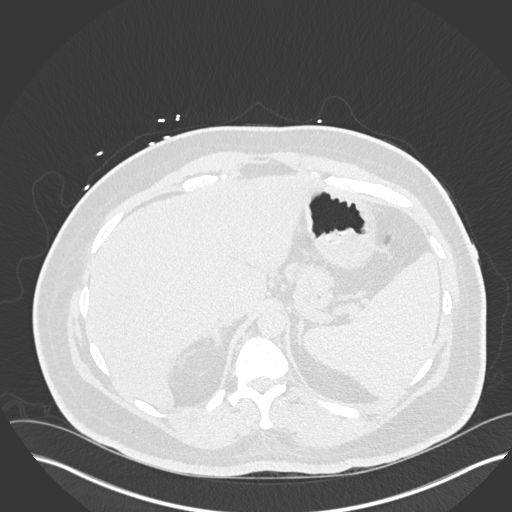
[im 33/148  lung]
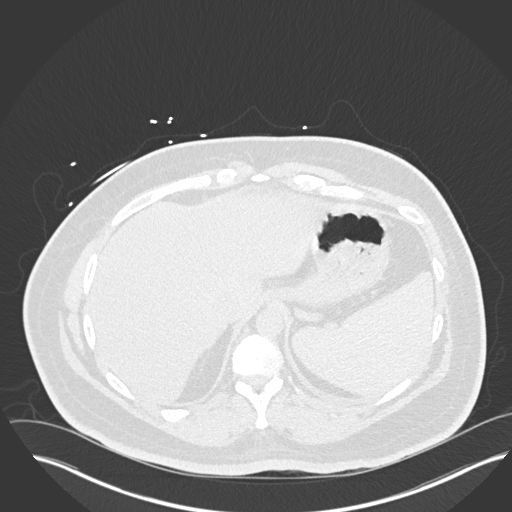
[im 44/148  lung]
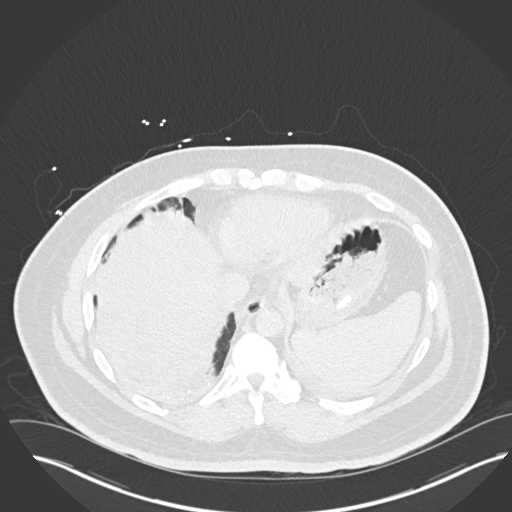
[im 55/148  mediastinal]
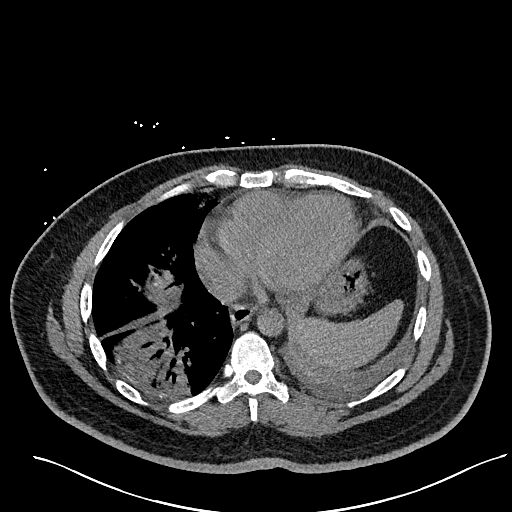
[im 55/148  lung]
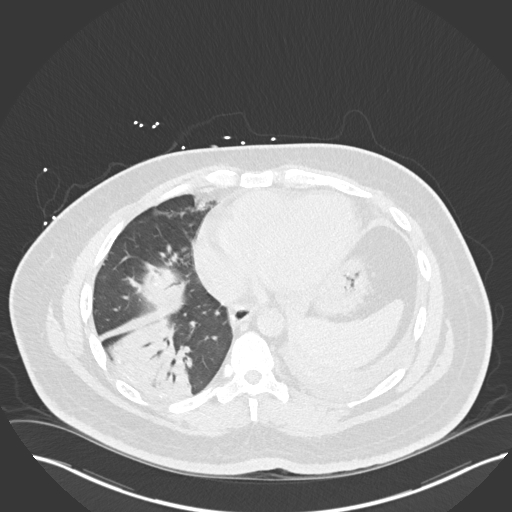
[im 66/148  lung]
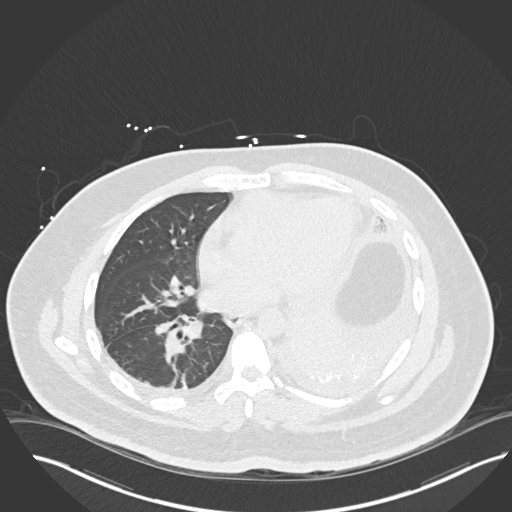
[im 82/148  lung]
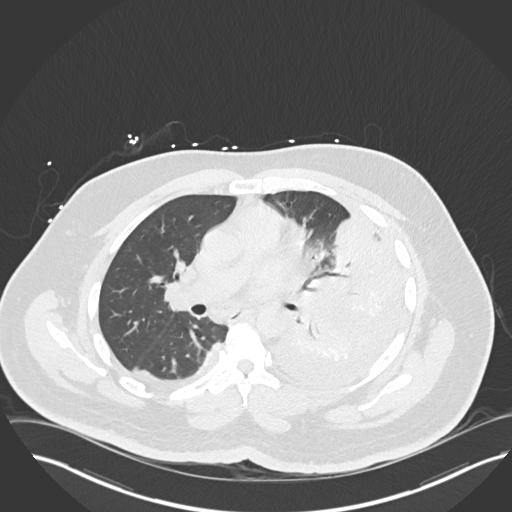
[im 93/148  lung]
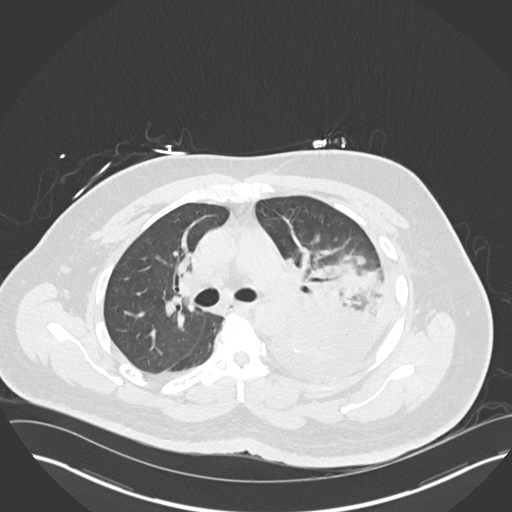
[im 104/148  mediastinal]
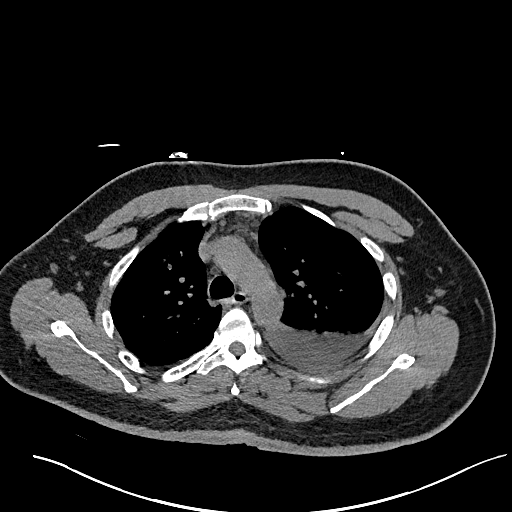
[im 104/148  lung]
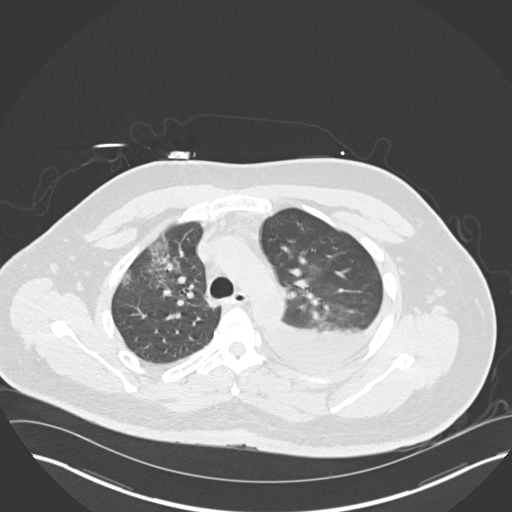
[im 115/148  lung]
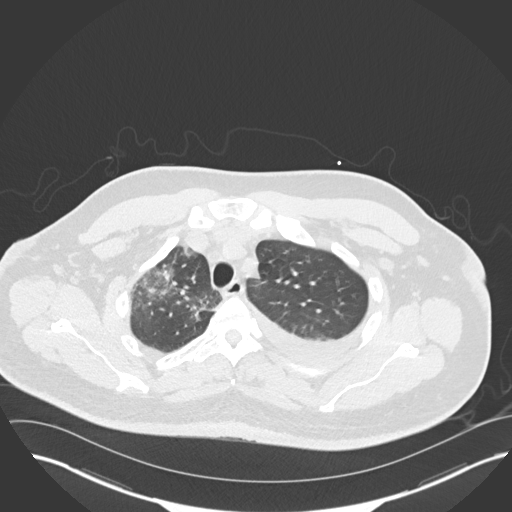
[im 126/148  lung]
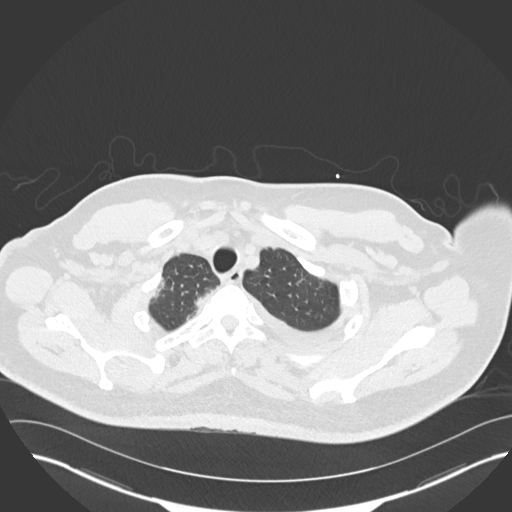
[im 137/148  lung]
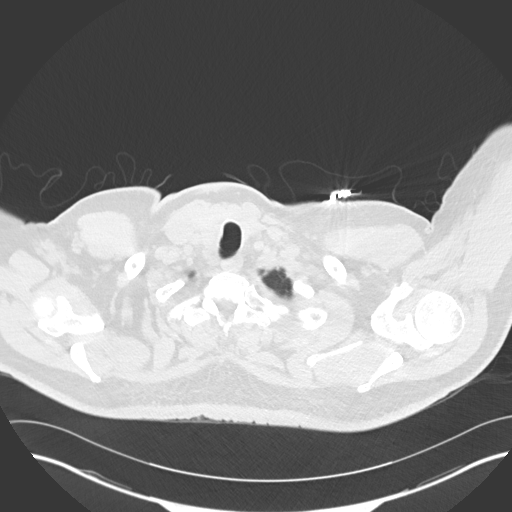

[Series 5: coronal · coronal · 0.66mm/px · 3 of 139 slices shown]
[im 28/139  lung]
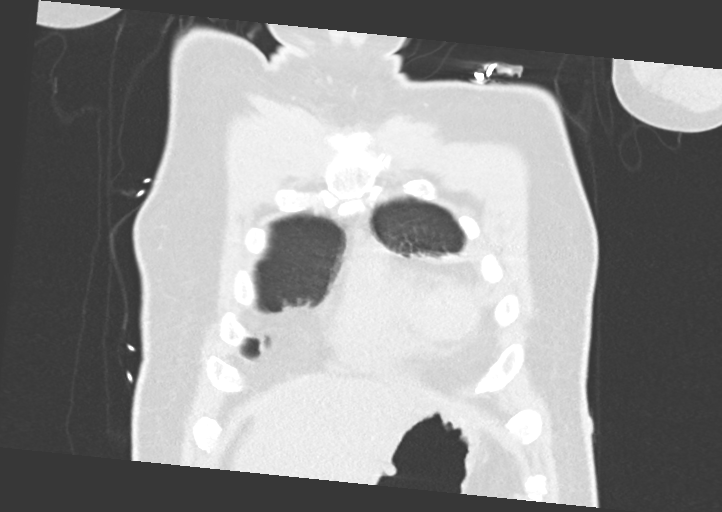
[im 56/139  lung]
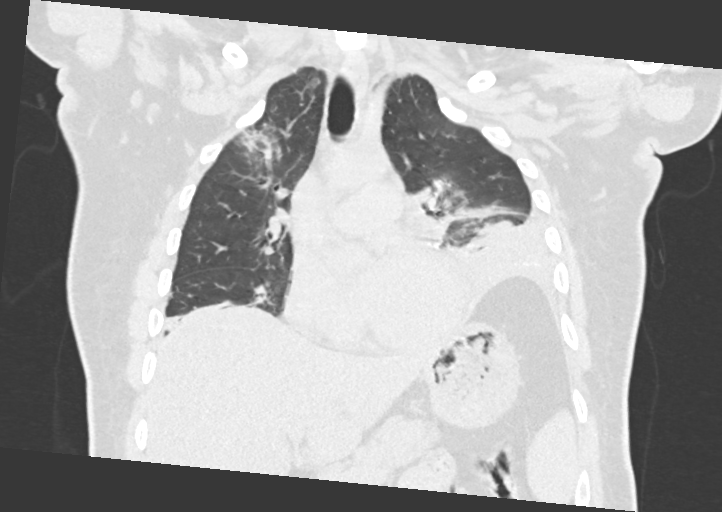
[im 83/139  lung]
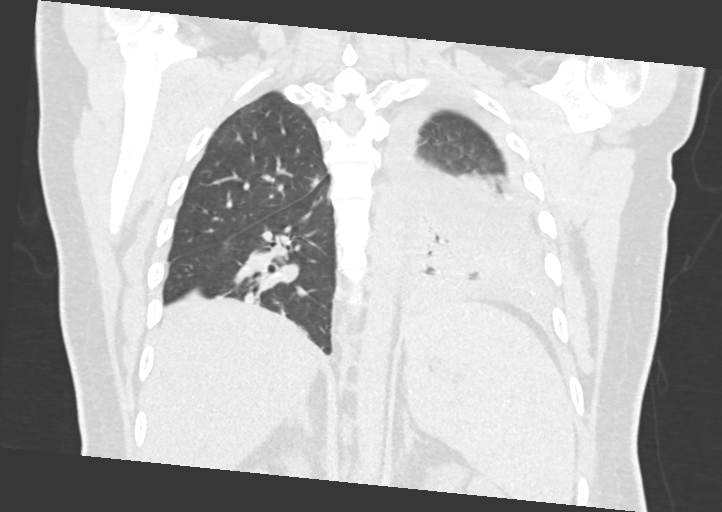

[15 of 36 positions shown; findings below may reference images not displayed]

FINDINGS: Cardiovascular: No significant vascular findings. Normal heart size.
No pericardial effusion.

Mediastinum/Nodes: No enlarged mediastinal or axillary lymph nodes.
Thyroid gland, trachea, and esophagus demonstrate no significant
findings.

Lungs/Pleura: There are large left and small right pleural effusions
with associated atelectasis, new since 08/03/2021. Moderate
ground-glass opacities are seen in both upper lobes with areas of
consolidation in the left lower and left upper lobes, new since
08/03/2021. There is no pneumothorax.

Upper Abdomen: No acute abnormality.

Musculoskeletal: No chest wall mass or suspicious bone lesions
identified.
IMPRESSION: 1. Large left and small right pleural effusions with associated
atelectasis.
2. Moderate ground-glass opacities in both lungs may represent
pneumonia.

## 2023-09-19 IMAGING — CR DG CHEST 2V
1 series · 2 of 2 positions shown · non-contrast
Comparison: August 06, 2021.

CLINICAL DATA: Shortness of breath in a 50-year-old male. History
of pulmonary embolism.

EXAM:
CHEST - 2 VIEW

[Series 1: dg chest 2 view · 0.14mm/px · 2 of 2 slices shown]
[im 1/2]
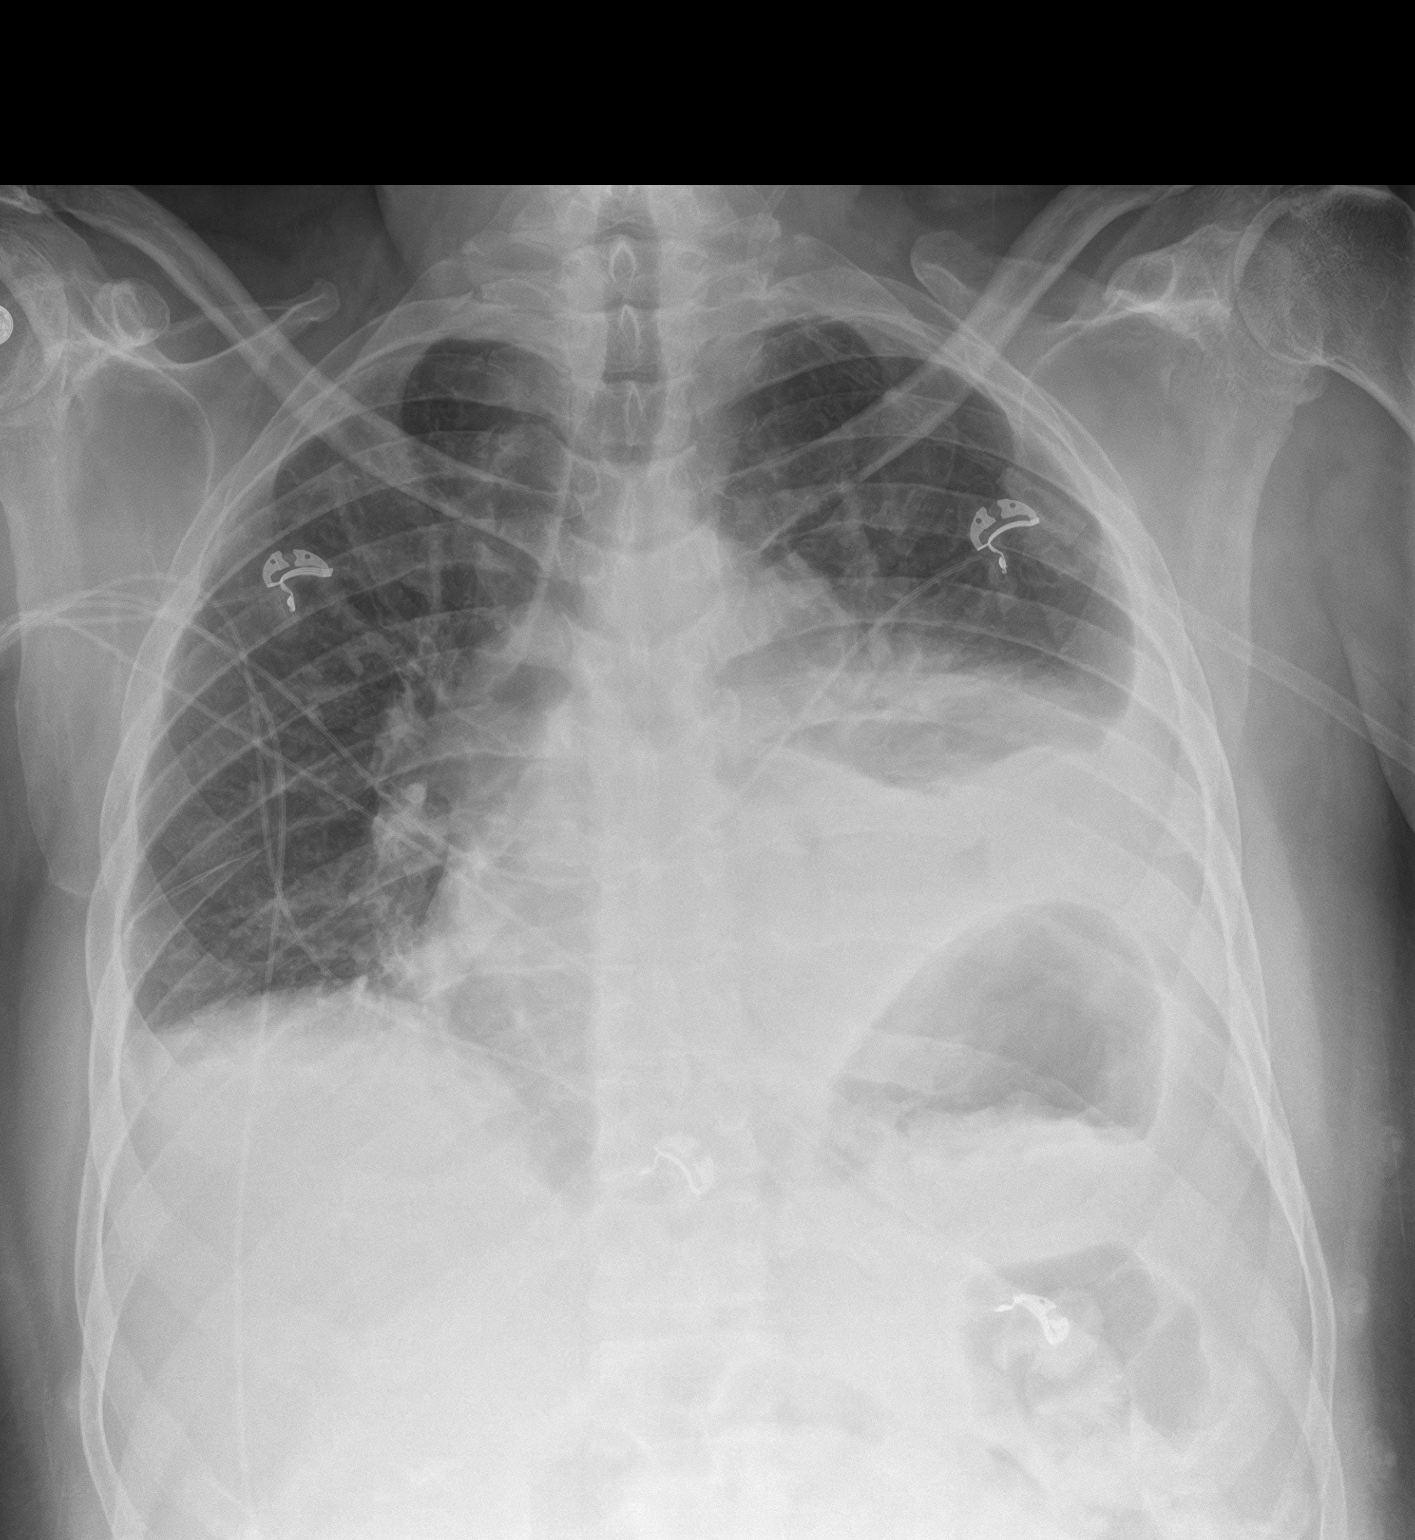
[im 2/2]
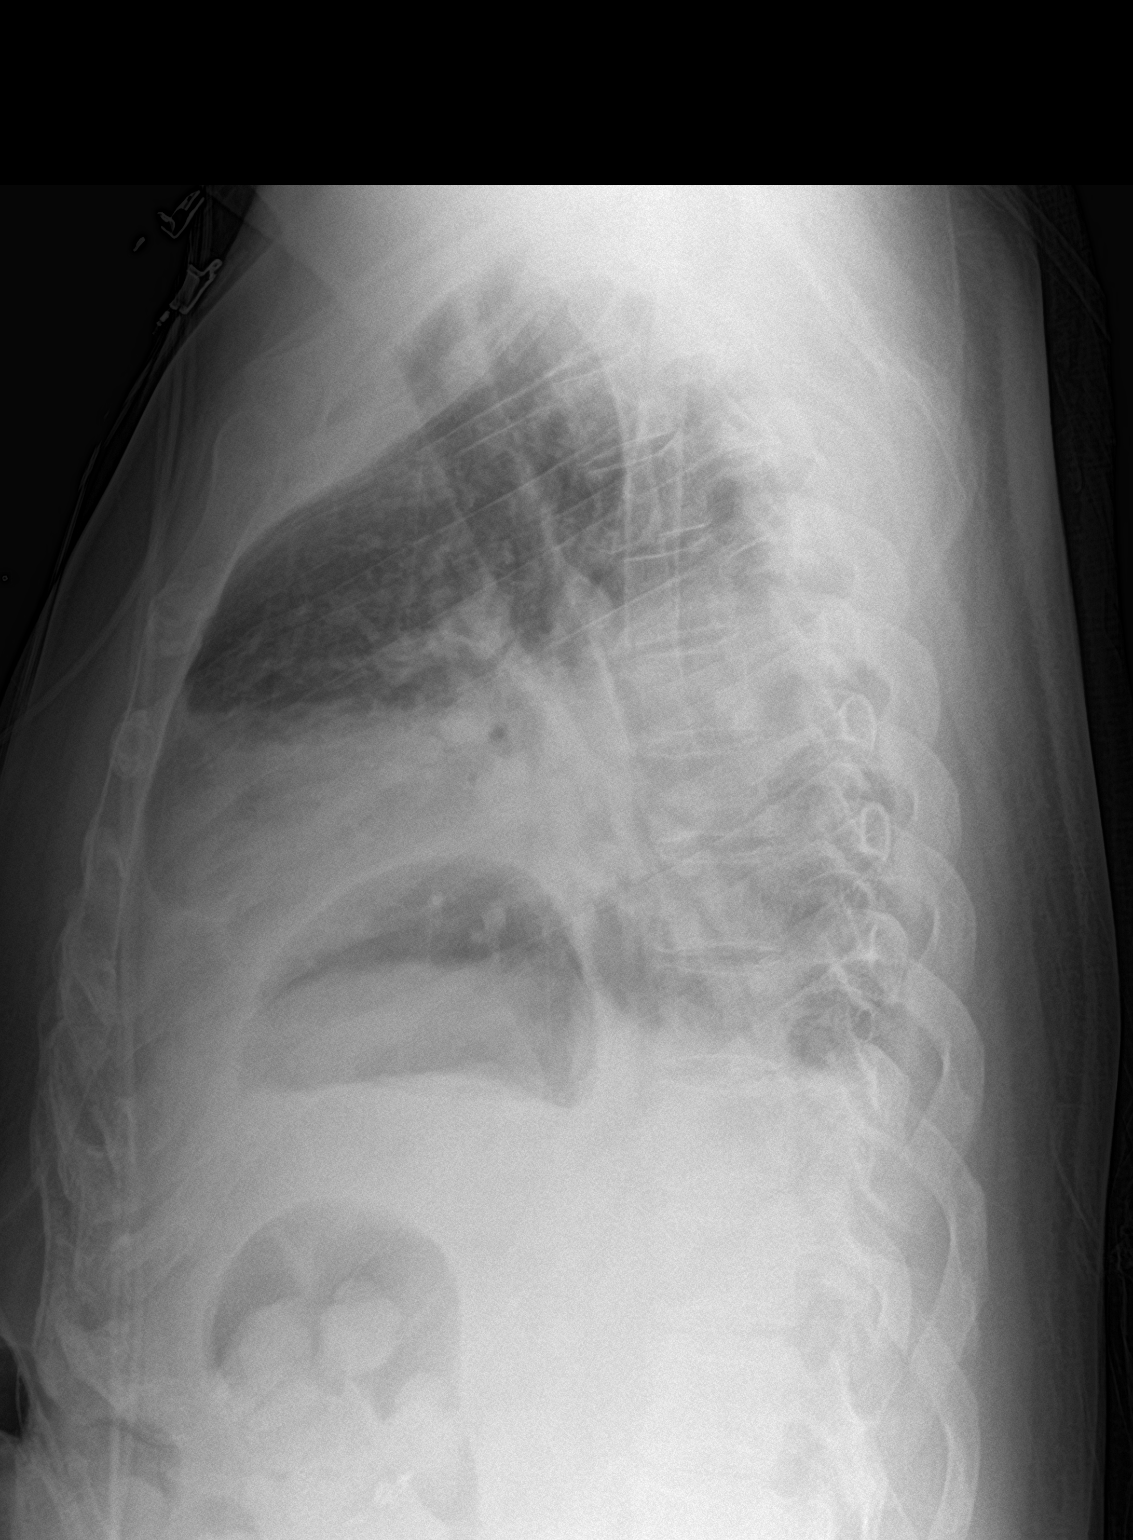

[2 of 2 positions shown; findings below may reference images not displayed]

FINDINGS: Stable appearance of cardiomediastinal contours accounting for
obscured cardiac borders on the LEFT due to persistent LEFT-sided
pleural effusion and basilar airspace disease. This appearance is
unchanged compared to recent imaging. No pneumothorax.

Patchy RIGHT basilar opacities likely atelectatic changes.

Minimal RIGHT basilar opacities are similar to previous imaging.
There is no visible pneumothorax. EKG leads project over the chest.

On limited assessment there is no acute skeletal process.
IMPRESSION: Stable appearance of the chest with LEFT lower lobe consolidative
process and concomitant effusion and scattered RIGHT basilar
opacities.

## 2024-03-13 ENCOUNTER — Emergency Department
Admission: EM | Admit: 2024-03-13 | Discharge: 2024-03-13 | Disposition: A | Discharge status: Home / Self Care | Attending: Emergency Medicine | Admitting: Emergency Medicine

## 2024-03-13 ENCOUNTER — Other Ambulatory Visit: Payer: Self-pay

## 2024-03-13 ENCOUNTER — Emergency Department

## 2024-03-13 DIAGNOSIS — R519 Headache, unspecified: Secondary | ICD-10-CM | POA: Insufficient documentation

## 2024-03-13 DIAGNOSIS — Z7901 Long term (current) use of anticoagulants: Secondary | ICD-10-CM | POA: Diagnosis not present

## 2024-03-13 LAB — CBC WITH DIFFERENTIAL/PLATELET
Abs Immature Granulocytes: 0.02 10*3/uL (ref 0.00–0.07)
Basophils Absolute: 0 10*3/uL (ref 0.0–0.1)
Basophils Relative: 0 %
Eosinophils Absolute: 0.2 10*3/uL (ref 0.0–0.5)
Eosinophils Relative: 2 %
HCT: 34.6 % — ABNORMAL LOW (ref 39.0–52.0)
Hemoglobin: 11.8 g/dL — ABNORMAL LOW (ref 13.0–17.0)
Immature Granulocytes: 0 %
Lymphocytes Relative: 26 %
Lymphs Abs: 2.2 10*3/uL (ref 0.7–4.0)
MCH: 24.8 pg — ABNORMAL LOW (ref 26.0–34.0)
MCHC: 34.1 g/dL (ref 30.0–36.0)
MCV: 72.7 fL — ABNORMAL LOW (ref 80.0–100.0)
Monocytes Absolute: 0.6 10*3/uL (ref 0.1–1.0)
Monocytes Relative: 7 %
Neutro Abs: 5.3 10*3/uL (ref 1.7–7.7)
Neutrophils Relative %: 65 %
Platelets: 218 10*3/uL (ref 150–400)
RBC: 4.76 MIL/uL (ref 4.22–5.81)
RDW: 16 % — ABNORMAL HIGH (ref 11.5–15.5)
WBC: 8.3 10*3/uL (ref 4.0–10.5)
nRBC: 0 % (ref 0.0–0.2)

## 2024-03-13 LAB — BASIC METABOLIC PANEL WITH GFR
Anion gap: 8 (ref 5–15)
BUN: 8 mg/dL (ref 6–20)
CO2: 27 mmol/L (ref 22–32)
Calcium: 9.4 mg/dL (ref 8.9–10.3)
Chloride: 102 mmol/L (ref 98–111)
Creatinine, Ser: 0.8 mg/dL (ref 0.61–1.24)
GFR, Estimated: >60 mL/min (ref >60)
Glucose, Bld: 241 mg/dL — ABNORMAL HIGH (ref 70–99)
Potassium: 4.6 mmol/L (ref 3.5–5.1)
Sodium: 137 mmol/L (ref 135–145)

## 2024-03-13 MED ORDER — SODIUM CHLORIDE 0.9 % IV BOLUS
500.0000 mL | Freq: Once | INTRAVENOUS | Status: AC
  Administered 2024-03-13: 500 mL via INTRAVENOUS

## 2024-03-13 MED ORDER — MAGNESIUM SULFATE 2 GM/50ML IV SOLN
2.0000 g | Freq: Once | INTRAVENOUS | Status: AC
  Administered 2024-03-13: 2 g via INTRAVENOUS
  Filled 2024-03-13: qty 50

## 2024-03-13 MED ORDER — KETOROLAC TROMETHAMINE 15 MG/ML IJ SOLN
15.0000 mg | Freq: Once | INTRAMUSCULAR | Status: AC
  Administered 2024-03-13: 15 mg via INTRAVENOUS
  Filled 2024-03-13: qty 1

## 2024-03-13 MED ORDER — PROCHLORPERAZINE EDISYLATE 10 MG/2ML IJ SOLN
10.0000 mg | Freq: Once | INTRAMUSCULAR | Status: AC
  Administered 2024-03-13: 10 mg via INTRAVENOUS
  Filled 2024-03-13: qty 2

## 2024-03-13 MED ORDER — ACETAMINOPHEN 500 MG PO TABS
1000.0000 mg | ORAL_TABLET | Freq: Once | ORAL | Status: AC
  Administered 2024-03-13: 1000 mg via ORAL
  Filled 2024-03-13: qty 2

## 2024-03-13 MED ORDER — DIPHENHYDRAMINE HCL 50 MG/ML IJ SOLN
25.0000 mg | Freq: Once | INTRAMUSCULAR | Status: AC
  Administered 2024-03-13: 25 mg via INTRAVENOUS
  Filled 2024-03-13: qty 1

## 2024-03-13 NOTE — Discharge Instructions (Signed)
 I think your symptoms most match a tension type or migraine type headache.  Take Tylenol  650 mg every 6 hours as needed for headache pain.  Make sure you get plenty of sleep.  Thank you for choosing us  for your health care today!  Please see your primary doctor this week for a follow up appointment.   If you have any new, worsening, or unexpected symptoms call your doctor right away or come back to the emergency department for reevaluation.  It was my pleasure to care for you today.   Arron Large Margery Sheets, MD

## 2024-03-13 NOTE — ED Triage Notes (Addendum)
 Pt reports headache since Monday evening, pt reports pain is waking him up throughout the night. Pt denies accompanying symptoms.  Pt reports 24 hours before headache began on Monday he rolled out of bed and hit his head on the night stand, pt takes eliquis  daily. Denies hx of same.

## 2024-03-13 NOTE — ED Provider Notes (Addendum)
 [facility] Provider Note    Event Date/Time   First MD Initiated Contact with Patient 03/13/24 [phone]     (approximate)   History   Headache   HPI  William Lin is a 53 y.o. male   Past medical history of PE on Eliquis , variant sickle cell disease, who presents emerged part with a headache.  He had a minor head trauma on Sunday night where he hit his head on the dresser but did not have pain or passing out.  The next night he developed a mild headache on the right side of his head in the setting of multiple life stressors and poor sleep.  It was mild at first but progressively got worse over the last couple of days.  It is a squeezing sensation to the right side.    No visual changes no motor or sensory deficits.  No history of migraine headaches or headaches in general.  No other acute medical complaints.   External Medical Documents Reviewed: Hematology note from earlier this month      Physical Exam   Triage Vital Signs: ED Triage Vitals  Encounter Vitals Group     BP 03/13/24 0228 133/83     Systolic BP Percentile --      Diastolic BP Percentile --      Pulse Rate 03/13/24 0228 73     Resp 03/13/24 0228 18     Temp 03/13/24 0228 98.2 F (36.8 C)     Temp Source 03/13/24 0228 Oral     SpO2 03/13/24 0228 100 %     Weight 03/13/24 0227 213 lb (96.6 kg)     Height 03/13/24 0227 5\' 10"  (1.778 m)     Head Circumference --      Peak Flow --      Pain Score 03/13/24 0227 6     Pain Loc --      Pain Education --      Exclude from Growth Chart --     Most recent vital signs: Vitals:   03/13/24 0228  BP: 133/83  Pulse: 73  Resp: 18  Temp: 98.2 F (36.8 C)  SpO2: 100%    General: Awake, no distress.  CV:  Good peripheral perfusion.  Resp:  Normal effort.  Abd:  No distention.  Other:  Awake alert comfortable with no temporal tenderness, extraocular movements intact, pupils equal round reactive, motor sensor exam normal no obvious  signs of head trauma on external exam or tenderness to palpation along the skull or facial bones.  Neck supple forage of motion no midline tenderness to the C-spine.   ED Results / Procedures / Treatments   Labs (all labs ordered are listed, but only abnormal results are displayed) Labs Reviewed  CBC WITH DIFFERENTIAL/PLATELET - Abnormal; Notable for the following components:      Result Value   Hemoglobin 11.8 (*)    HCT 34.6 (*)    MCV 72.7 (*)    MCH 24.8 (*)    RDW 16.0 (*)    All other components within normal limits  BASIC METABOLIC PANEL WITH GFR - Abnormal; Notable for the following components:   Glucose, Bld 241 (*)    All other components within normal limits     I ordered and reviewed the above labs they are notable for cell counts electrolytes unremarkable.    RADIOLOGY I independently reviewed and interpreted CT of the head and see Norins bleeding or midline shift I also  reviewed radiologist's formal read.   PROCEDURES:  Critical Care performed: No  Procedures   MEDICATIONS ORDERED IN ED: Medications  prochlorperazine (COMPAZINE) injection 10 mg (10 mg Intravenous Given 03/13/24 0344)  diphenhydrAMINE  (BENADRYL ) injection 25 mg (25 mg Intravenous Given 03/13/24 0344)  ketorolac  (TORADOL ) 15 MG/ML injection 15 mg (15 mg Intravenous Given 03/13/24 0344)  magnesium  sulfate IVPB 2 g 50 mL (0 g Intravenous Stopped 03/13/24 0505)  sodium chloride  0.9 % bolus 500 mL (0 mLs Intravenous Stopped 03/13/24 0505)  acetaminophen  (TYLENOL ) tablet 1,000 mg (1,000 mg Oral Given 03/13/24 0342)     IMPRESSION / MDM / ASSESSMENT AND PLAN / ED COURSE  I reviewed the triage vital signs and the nursing notes.                                Patient's presentation is most consistent with acute presentation with potential threat to life or bodily function.  Differential diagnosis includes, but is not limited to, ICH, SAH, migraine or tension type headache, temporal arteritis,  considered but less likely meningitis   The patient is on the cardiac monitor to evaluate for evidence of arrhythmia and/or significant heart rate changes.  MDM:    Gradual onset right-sided headache in the setting of multiple life stressors and poor sleep but also complicated by the fact that he had a minor head trauma 24 hours preceding the onset of symptoms and he is on Eliquis .  I got a head CT given new headache and minor head injury on Eliquis  with headache but fortunately this looks negative for bleed.  I doubt SAH given the mild unilateral progressively worsening headache and negative CT today.  More likely migraine or tension type headache.  Treat with migraine cocktail.  I considered temporal arteritis but doubt given no visual changes or temporal tenderness.  Neurologic exam intact doubt stroke.  Plan for reassessment after migraine cocktail and anticipate discharge   I checked in with the patient after treatment he "feels 100% better"  I considered hospitalization for admission or observation however given unremarkable workup as above, low clinical concern for head bleed or stroke or other neurologic emergencies, other he can follow-up as an outpatient.        FINAL CLINICAL IMPRESSION(S) / ED DIAGNOSES   Final diagnoses:  Bad headache     Rx / DC Orders   ED Discharge Orders     None        Note:  This document was prepared using Dragon voice recognition software and may include unintentional dictation errors.    Buell Carmin, MD 03/13/24 1610    Buell Carmin, MD 03/13/24 [phone]

## 2024-10-23 ENCOUNTER — Emergency Department (HOSPITAL_COMMUNITY)
Admission: EM | Admit: 2024-10-23 | Discharge: 2024-10-23 | Disposition: A | Discharge status: Home / Self Care | Attending: Emergency Medicine | Admitting: Emergency Medicine

## 2024-10-23 ENCOUNTER — Other Ambulatory Visit: Payer: Self-pay

## 2024-10-23 ENCOUNTER — Encounter (HOSPITAL_COMMUNITY): Payer: Self-pay

## 2024-10-23 DIAGNOSIS — Z79899 Other long term (current) drug therapy: Secondary | ICD-10-CM | POA: Diagnosis not present

## 2024-10-23 DIAGNOSIS — I10 Essential (primary) hypertension: Secondary | ICD-10-CM | POA: Diagnosis not present

## 2024-10-23 DIAGNOSIS — H579 Unspecified disorder of eye and adnexa: Secondary | ICD-10-CM | POA: Diagnosis present

## 2024-10-23 DIAGNOSIS — E119 Type 2 diabetes mellitus without complications: Secondary | ICD-10-CM | POA: Diagnosis not present

## 2024-10-23 DIAGNOSIS — Z7901 Long term (current) use of anticoagulants: Secondary | ICD-10-CM | POA: Diagnosis not present

## 2024-10-23 DIAGNOSIS — H4312 Vitreous hemorrhage, left eye: Secondary | ICD-10-CM | POA: Insufficient documentation

## 2024-10-23 DIAGNOSIS — Z86711 Personal history of pulmonary embolism: Secondary | ICD-10-CM | POA: Diagnosis not present

## 2024-10-23 DIAGNOSIS — Z7984 Long term (current) use of oral hypoglycemic drugs: Secondary | ICD-10-CM | POA: Insufficient documentation

## 2024-10-23 NOTE — ED Triage Notes (Signed)
 Denies headaches, nausea, vomiting, numbness, tingling.

## 2024-10-23 NOTE — Discharge Instructions (Signed)
 We evaluated you for your flashers and floaters in your left eye.  Your vision was normal, and your ultrasound showed possible signs of vitreous hemorrhage (bleeding in your eyeball).  You may also have something called a vitreous detachment.  We do not think you have a retinal detachment but would like you to follow-up with an eye doctor to make sure.  They will perform a more detailed eye exam.  Please go see Dr. Octavia today after leaving the emergency department.

## 2024-10-23 NOTE — ED Triage Notes (Signed)
 PT arrives via POV. PT reports he has been seeing floaters and flashing light in left eye since yesterday. PT denies any other symptoms. He is AxOx4. Pt states his eliquis  was stopped for 3 days because he had a colonoscopy on Monday.

## 2024-10-23 NOTE — ED Provider Notes (Signed)
 " [facility] Provider Note  CSN: [id number] Arrival date & time: 10/23/24 9060  Chief Complaint(s) Eye Problem  HPI William Lin is a 54 y.o. male history of diabetes, hypertension, sickle cell anemia, prior PE on Xarelto presenting with eye issue.  Patient reports floaters, occasional flashes in the left eye since yesterday.  No eye pain or eye trauma.  Still can read see mostly normally.  Denies history of similar.  No headaches, nausea, vomiting, fevers or chills.  No numbness, weakness, tingling.   Past Medical History Past Medical History:  Diagnosis Date   Anxiety    Diabetes mellitus without complication (HCC)    HTN (hypertension)    Sickle cell anemia (HCC)    Patient Active Problem List   Diagnosis Date Noted   Pulmonary embolism (HCC) 08/05/2021   Chest pain on breathing    Dyspnea    Shortness of breath    Acute pulmonary embolism (HCC) 08/03/2021   HTN (hypertension) 08/03/2021   Diabetes mellitus without complication (HCC)    Depression with anxiety    Pleural effusion 07/31/2011   Home Medication(s) Prior to Admission medications  Medication Sig Start Date End Date Taking? Authorizing Provider  acetaminophen  (TYLENOL ) 500 MG tablet Take 500 mg by mouth every 6 (six) hours as needed.    [provider]  albuterol  (VENTOLIN  HFA) 108 (90 Base) MCG/ACT inhaler Inhale 2 puffs into the lungs every 4 (four) hours as needed for wheezing or shortness of breath. 08/12/21   [place], Camellia PARAS, DO  apixaban  (ELIQUIS ) 5 MG TABS tablet Take 1 tablet (5 mg total) by mouth 2 (two) times daily. 08/12/21 11/10/21  [place], Camellia PARAS, DO  chlorpheniramine-HYDROcodone (TUSSIONEX) 10-8 MG/5ML SUER Take 5 mLs by mouth every 12 (twelve) hours as needed for cough. 08/12/21   [place], Camellia PARAS, DO  ibuprofen (ADVIL,MOTRIN) 200 MG tablet Take 400 mg by mouth every 6 (six) hours as needed.      [provider]  lisinopril  (ZESTRIL ) 5 MG  tablet Take 5 mg by mouth daily. 07/30/21   [provider]  metFORMIN (GLUCOPHAGE-XR) 500 MG 24 hr tablet Take 2 tablets by mouth in the morning and at bedtime. 08/12/20   [provider]  pioglitazone (ACTOS) 15 MG tablet Take 15 mg by mouth daily. 07/31/21   [provider]  sertraline  (ZOLOFT ) 100 MG tablet Take 100 mg by mouth daily.    [provider]                                                                                                                                    Past Surgical History Past Surgical History:  Procedure Laterality Date   CHOLECYSTECTOMY     DENTAL SURGERY     polynodial cyst     PULMONARY THROMBECTOMY Bilateral 08/09/2021   Procedure: PULMONARY THROMBECTOMY;  Surgeon: Jama Cordella MATSU, MD;  Location: [facility];  Service: Cardiovascular;  Laterality: Bilateral;   ROTATOR CUFF REPAIR     Left   Family History Family History  Problem Relation Age of Onset   Hypertension Other    Hypertension Mother    Arthritis Mother     Social History Social History[1] Allergies Aspirin, Percocet [oxycodone -acetaminophen ], and Sulfonamide derivatives  Review of Systems Review of Systems  All other systems reviewed and are negative.   Physical Exam Vital Signs  I have reviewed the triage vital signs BP 120/81 (BP Location: Right Arm)   Pulse 97   Temp 98.8 F (37.1 C) (Oral)   Resp 16   Ht 5' 10 (1.778 m)   Wt 99.8 kg   SpO2 98%   BMI 31.57 kg/m  Physical Exam Vitals and nursing note reviewed.  Constitutional:      General: He is not in acute distress.    Appearance: Normal appearance.  HENT:     Mouth/Throat:     Mouth: Mucous membranes are moist.  Eyes:     General: No scleral icterus.       Right eye: No discharge.        Left eye: No discharge.     Extraocular Movements: Extraocular movements intact.     Conjunctiva/sclera: Conjunctivae normal.     Pupils: Pupils are equal, round, and  reactive to light.     Comments: No hyphema or hypopyon  Cardiovascular:     Rate and Rhythm: Normal rate and regular rhythm.  Pulmonary:     Effort: Pulmonary effort is normal. No respiratory distress.     Breath sounds: Normal breath sounds.  Abdominal:     General: Abdomen is flat.     Palpations: Abdomen is soft.     Tenderness: There is no abdominal tenderness.  Musculoskeletal:     Right lower leg: No edema.     Left lower leg: No edema.  Skin:    General: Skin is warm and dry.     Capillary Refill: Capillary refill takes less than 2 seconds.  Neurological:     Mental Status: He is alert and oriented to person, place, and time. Mental status is at baseline.     Cranial Nerves: No cranial nerve deficit.  Psychiatric:        Mood and Affect: Mood normal.        Behavior: Behavior normal.     ED Results and Treatments Labs (all labs ordered are listed, but only abnormal results are displayed) Labs Reviewed - No data to display                                                                                                                        Radiology No results found.  Pertinent labs & imaging results that were available during my care of the patient were reviewed by me and considered in my medical decision making (see MDM for details).  Medications Ordered in ED Medications -  No data to display                                                                                                                                   Procedures Ultrasound ED Ocular  Date/Time: 10/23/2024 1:48 PM  Performed by: Francesca Elsie CROME, MD Authorized by: Francesca Elsie CROME, MD   PROCEDURE DETAILS:    Indications: visual change     Assessed:  Left eye   Left eye axial view: obtained     Left eye saggital view: obtained     Images: archived     Limitations:  None LEFT EYE FINDINGS:     vitreous hemorrhage in left eye Comments:     Possible vitreous  detatchment   (including critical care time)  Medical Decision Making / ED Course   MDM:  54 year old with painless visual disturbance, primarily floaters and occasional flashes of light.  External ocular exam is reassuring.  He has no eye pain.  Pupils are reactive.  Visual acuity is reassuring.  Symptoms are concerning for posterior ocular pathology such as retinal detachment, vitreous detachment, vitreous hemorrhage.  Ultrasound was obtained that shows signs of vitreous hemorrhage.  Possible vitreous detachment.  Less consistent with retinal detachment but may have retinal tear with flashes.  Discussed with Dr. Octavia with ophthalmology who can see patient today.  Patient is stable from ER perspective and stable for discharge for close outpatient appointment with ophthalmology.      Additional history obtained:  -External records from outside source obtained and reviewed including: Chart review including previous notes, labs, imaging, consultation notes including medical records     Medicines ordered and prescription drug management: No orders of the defined types were placed in this encounter.   -I have reviewed the patients home medicines and have made adjustments as needed   Consultations Obtained: I requested consultation with the ophthalmologist dr Octavia ,  and discussed lab and imaging findings as well as pertinent plan - they recommend: discharge to follow up w/ him today    Co morbidities that complicate the patient evaluation  Past Medical History:  Diagnosis Date   Anxiety    Diabetes mellitus without complication (HCC)    HTN (hypertension)    Sickle cell anemia (HCC)       Dispostion: Disposition decision including need for hospitalization was considered, and patient discharged from emergency department.    Final Clinical Impression(s) / ED Diagnoses Final diagnoses:  Vitreous hemorrhage of left eye (HCC)     This chart was dictated using voice  recognition software.  Despite best efforts to proofread,  errors can occur which can change the documentation meaning.     [1]  Social History Tobacco Use   Smoking status: Never   Smokeless tobacco: Never  Substance Use Topics   Alcohol use: No   Drug use: Never     Francesca Elsie CROME, MD 10/23/24 1351  "
